# Patient Record
Sex: Female | Born: 1980 | Race: White | Hispanic: No | Marital: Married | Smoking: Never smoker
Health system: Southern US, Community
[De-identification: ages and names within clinical notes are randomized; demographics above are authoritative.]

## PROBLEM LIST (undated history)

## (undated) DIAGNOSIS — I10 Essential (primary) hypertension: Secondary | ICD-10-CM

## (undated) DIAGNOSIS — E119 Type 2 diabetes mellitus without complications: Secondary | ICD-10-CM

## (undated) HISTORY — PX: CHOLECYSTECTOMY: SHX55

## (undated) HISTORY — PX: THYROIDECTOMY: SHX17

---

## 2008-04-14 ENCOUNTER — Emergency Department: Payer: Self-pay | Admitting: Emergency Medicine

## 2008-04-15 DIAGNOSIS — Z9049 Acquired absence of other specified parts of digestive tract: Secondary | ICD-10-CM | POA: Insufficient documentation

## 2008-08-21 ENCOUNTER — Encounter: Payer: Self-pay | Admitting: Maternal and Fetal Medicine

## 2008-08-25 ENCOUNTER — Ambulatory Visit: Payer: Self-pay | Admitting: Obstetrics and Gynecology

## 2008-08-28 ENCOUNTER — Encounter: Payer: Self-pay | Admitting: Obstetrics & Gynecology

## 2008-09-01 ENCOUNTER — Ambulatory Visit: Payer: Self-pay | Admitting: Obstetrics and Gynecology

## 2008-09-04 ENCOUNTER — Encounter: Payer: Self-pay | Admitting: Family

## 2008-09-07 ENCOUNTER — Encounter: Payer: Self-pay | Admitting: Family

## 2008-09-07 ENCOUNTER — Encounter: Payer: Self-pay | Admitting: Maternal and Fetal Medicine

## 2008-09-07 ENCOUNTER — Ambulatory Visit: Payer: Self-pay | Admitting: Obstetrics and Gynecology

## 2008-09-12 ENCOUNTER — Ambulatory Visit: Payer: Self-pay | Admitting: Internal Medicine

## 2008-09-12 ENCOUNTER — Ambulatory Visit: Payer: Self-pay | Admitting: Obstetrics & Gynecology

## 2008-09-16 ENCOUNTER — Ambulatory Visit: Payer: Self-pay | Admitting: Obstetrics & Gynecology

## 2008-09-22 ENCOUNTER — Encounter: Payer: Self-pay | Admitting: Maternal & Fetal Medicine

## 2009-11-26 ENCOUNTER — Ambulatory Visit (HOSPITAL_COMMUNITY): Admission: RE | Admit: 2009-11-26 | Discharge: 2009-11-26 | Payer: Self-pay | Admitting: Obstetrics and Gynecology

## 2010-02-27 ENCOUNTER — Encounter: Payer: Self-pay | Admitting: Obstetrics and Gynecology

## 2010-06-25 ENCOUNTER — Inpatient Hospital Stay (HOSPITAL_COMMUNITY)
Admission: AD | Admit: 2010-06-25 | Discharge: 2010-06-28 | Disposition: A | Discharge status: Home / Self Care | Payer: BC Managed Care – PPO | Source: Ambulatory Visit | Attending: Obstetrics and Gynecology | Admitting: Obstetrics and Gynecology

## 2010-06-25 DIAGNOSIS — O10019 Pre-existing essential hypertension complicating pregnancy, unspecified trimester: Secondary | ICD-10-CM | POA: Insufficient documentation

## 2010-06-29 ENCOUNTER — Other Ambulatory Visit: Payer: Self-pay | Admitting: Obstetrics and Gynecology

## 2010-06-29 ENCOUNTER — Encounter (HOSPITAL_COMMUNITY): Payer: BC Managed Care – PPO

## 2010-06-29 LAB — BASIC METABOLIC PANEL
BUN: 9 mg/dL (ref 6–23)
Calcium: 9.2 mg/dL (ref 8.4–10.5)
Creatinine, Ser: 0.56 mg/dL (ref 0.4–1.2)
GFR calc non Af Amer: >60 mL/min (ref >60)
Glucose, Bld: 114 mg/dL — ABNORMAL HIGH (ref 70–99)

## 2010-06-29 LAB — CBC
HCT: 38.1 % (ref 36.0–46.0)
MCH: 31.2 pg (ref 26.0–34.0)
MCHC: 33.9 g/dL (ref 30.0–36.0)
MCV: 92.3 fL (ref 78.0–100.0)
RDW: 13.8 % (ref 11.5–15.5)

## 2010-07-09 ENCOUNTER — Other Ambulatory Visit: Payer: Self-pay | Admitting: Obstetrics and Gynecology

## 2010-07-09 ENCOUNTER — Inpatient Hospital Stay (HOSPITAL_COMMUNITY)
Admission: EM | Admit: 2010-07-09 | Discharge: 2010-07-13 | DRG: 370 | Disposition: A | Discharge status: Home / Self Care | Payer: BC Managed Care – PPO | Source: Ambulatory Visit | Attending: Obstetrics and Gynecology | Admitting: Obstetrics and Gynecology

## 2010-07-09 DIAGNOSIS — O1002 Pre-existing essential hypertension complicating childbirth: Secondary | ICD-10-CM | POA: Diagnosis present

## 2010-07-09 DIAGNOSIS — O321XX0 Maternal care for breech presentation, not applicable or unspecified: Principal | ICD-10-CM | POA: Diagnosis present

## 2010-07-09 DIAGNOSIS — E119 Type 2 diabetes mellitus without complications: Secondary | ICD-10-CM | POA: Diagnosis present

## 2010-07-09 DIAGNOSIS — N83209 Unspecified ovarian cyst, unspecified side: Secondary | ICD-10-CM | POA: Diagnosis present

## 2010-07-09 DIAGNOSIS — O34599 Maternal care for other abnormalities of gravid uterus, unspecified trimester: Secondary | ICD-10-CM | POA: Diagnosis present

## 2010-07-09 DIAGNOSIS — O2432 Unspecified pre-existing diabetes mellitus in childbirth: Secondary | ICD-10-CM | POA: Diagnosis present

## 2010-07-09 DIAGNOSIS — O3660X0 Maternal care for excessive fetal growth, unspecified trimester, not applicable or unspecified: Secondary | ICD-10-CM | POA: Diagnosis present

## 2010-07-09 LAB — GLUCOSE, CAPILLARY
Glucose-Capillary: 83 mg/dL (ref 70–99)
Glucose-Capillary: 132 mg/dL — ABNORMAL HIGH (ref 70–99)

## 2010-07-09 LAB — TYPE AND SCREEN: ABO/RH(D): O POS

## 2010-07-09 LAB — ABO/RH: ABO/RH(D): O POS

## 2010-07-10 LAB — CBC
MCV: 92.7 fL (ref 78.0–100.0)
Platelets: 192 10*3/uL (ref 150–400)
RDW: 13.8 % (ref 11.5–15.5)
WBC: 10.9 10*3/uL — ABNORMAL HIGH (ref 4.0–10.5)

## 2010-07-10 LAB — GLUCOSE, CAPILLARY

## 2010-07-10 LAB — CCBB MATERNAL DONOR DRAW

## 2010-07-12 LAB — GLUCOSE, CAPILLARY: Glucose-Capillary: 86 mg/dL (ref 70–99)

## 2010-07-14 ENCOUNTER — Inpatient Hospital Stay (HOSPITAL_COMMUNITY)
Admission: AD | Admit: 2010-07-14 | Payer: BC Managed Care – PPO | Source: Ambulatory Visit | Admitting: Obstetrics and Gynecology

## 2010-07-15 ENCOUNTER — Encounter (HOSPITAL_COMMUNITY)
Admission: RE | Admit: 2010-07-15 | Discharge: 2010-07-15 | Disposition: A | Discharge status: Home / Self Care | Payer: BC Managed Care – PPO | Source: Ambulatory Visit | Attending: Obstetrics and Gynecology | Admitting: Obstetrics and Gynecology

## 2010-07-15 DIAGNOSIS — O923 Agalactia: Secondary | ICD-10-CM | POA: Insufficient documentation

## 2010-07-16 NOTE — H&P (Signed)
Tracey Kennedy, TEDRICK NO.:  [id number]  MEDICAL RECORD NO.:  [id number]           PATIENT TYPE:  LOCATION:                                 FACILITY:  PHYSICIAN:  Huel Cote, M.D. DATE OF BIRTH:  May 16, 1980  DATE OF ADMISSION:  07/09/2010 DATE OF DISCHARGE:                             HISTORY & PHYSICAL   HISTORY OF PRESENT ILLNESS:  The patient is a 30 year old G3, P0-0-2-0, who is coming in at 76 and 2/7 weeks' gestation with an estimated due date of July 14, 2010 by her last menstrual period consistent with an early ultrasound at 6 week.  The patient is presenting for cesarean section given the persistent breech presentation with a macrosomic fetus greater than the 95th percentile on her last ultrasound.  Prenatal care had been complicated by diabetes which was preexisting to pregnancy and controlled well with metformin 500 mg p.o. q.a.m. and 1000 mg q.p.m., this remained stable throughout pregnancy.  She also had chronic hypertension which required labetalol 200 mg p.o. twice daily which had been increased at one point in the pregnancy.  She also had a left ovarian cyst diagnosed in the pregnancy which measured 6 x 5 x 3 and appeared simple in nature and was followed on ultrasound which we will address at the time of cesarean section as well.  PRENATAL LABS ARE AS FOLLOWS:  O positive, antibody negative, RPR nonreactive, rubella immune, hepatitis B surface antigen negative, HIV negative, hemoglobin A1c early on in pregnancy was 5.6, hemoglobin electrophoresis showed normal at AA, gonorrhea negative, Chlamydia negative, first trimester screen was normal, alpha fetal protein was normal.  Cystic fibrosis screen was normal.  She had a normal anatomical ultrasound performed at 19-20 weeks' gestation.  Her group B strep was negative and of course she did not perform 1-hour Glucola due to her diabetic nature.  As stated her diabetes remained well  controlled throughout pregnancy on her metformin and she checked her blood sugars 2- 4 times daily as directed.  She was followed by nonstress test twice weekly on her last being 6 weeks of pregnancy due to her medications and these were always reassuring.  PAST OBSTETRICAL HISTORY:  Significant for 2 prior miscarriages, the first in 2010 and the second in 2011, 1 requiring a D and C.  PAST MEDICAL HISTORY:  Significant for hypertension and diabetes as discussed.  PAST GYNECOLOGICAL HISTORY:  Significant for polycystic ovarian syndrome and was able to conceive on metformin alone.  PAST SURGICAL HISTORY:  She had a cholecystectomy in March 2010 and a D and C with her miscarriage in 2010.  ALLERGIES:  None except for PENICILLINS.  CURRENT MEDICATIONS AS FOLLOWS: 1. Metformin 500 mg p.o. q.a.m. and 1000 mg p.o. at night. 2. She uses prenatal vitamins and the labetalol 200 mg p.o. twice     daily as well.  SOCIAL HISTORY:  She has no alcohol, drugs, or tobacco use.  She is married.  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  FAMILY MEDICAL HISTORY:  Significant for diabetes in her mother, chronic hypertension in her father, and multiple myeloma in her  mother.  PHYSICAL EXAMINATION:  VITAL SIGNS:  The patient's blood pressure is 120/90.  She weighs 190 pounds. CARDIAC:  Regular rate and rhythm. LUNGS:  Clear. ABDOMEN:  Gravid and nontender, for the fundal height of 41, cervix is 150, and -2 station with a breech presentation noted.  After careful discussion when the baby was found to be breech, the patient declined any attempted external cephalic version given the baby was also measuring macrosomic greater than 95th percentile.  The baby remained persistent breech over the next several weeks and thus a cesarean section has been scheduled.  The risks and benefits of C- section were discussed with the patient in detail including bleeding, infection, and  possible damage to bowel, bladder.  She understands these risks and desires to proceed with the surgery as stated.  We will also evaluate the left ovary with the surgery and either perform a cystectomy or an LSO as needed dependent on the ovaries appearance.  The patient understands all of these issues and desires to proceed as stated.     Huel Cote, M.D.     KR/MEDQ  D:  07/08/2010  T:  07/08/2010  Job:  [id number]  Electronically Signed by Huel Cote M.D. on 07/16/2010 11:00:00 PM

## 2010-07-16 NOTE — Op Note (Signed)
Tracey Kennedy, Tracey Kennedy           ACCOUNT NO.:  [id number]  MEDICAL RECORD NO.:  [id number]           PATIENT TYPE:  I  LOCATION:  9131                          [facility]:  [facility]  PHYSICIAN:  Huel Cote, M.D. DATE OF BIRTH:  Jun 13, 1980  DATE OF PROCEDURE:  07/09/2010 DATE OF DISCHARGE:                              OPERATIVE REPORT   PREOPERATIVE DIAGNOSES: 1. Term pregnancy at 39 weeks. 2. Breech presentation. 3. Diabetes. 4. Suspected macrosomia. 5. Left adnexal mass.  POSTOPERATIVE DIAGNOSES: 1. Term pregnancy at 39 weeks. 2. Breech presentation. 3. Diabetes. 4. Suspected macrosomia. 5. Left adnexal mass.  PROCEDURES:  Primary low transverse cesarean section with two-layer closure of uterus and a left salpingo-oophorectomy for a very large mucinous cyst.  SURGEON:  Huel Cote, MD  ASSISTANT:  Malachi Pro. Ambrose Mantle, MD  ANESTHESIA:  Spinal.  FINDINGS:  There is a viable female infant in the breech presentation, Apgars were 8 and 9, weight was 9 pounds 2 ounces.  There was normal uterus noted and a normal right ovary.  The left ovary was quite large and elongated with a cystic-appearing structure measuring approximately 7 x 5.  There was no normal ovary tissue visible and in attempting to shell out the cyst large amount of mucinous fluid was encountered and suctioned.  For this reason, decision was made to proceed with a left salpingo-oophorectomy.  SPECIMENS:  Left ovary and tube were sent to Pathology.  Placenta was sent to L and D.  ESTIMATED BLOOD LOSS:  800 mL.  URINE OUTPUT:  100 mL clear urine.  IV FLUIDS:  2500 mL LR.  COMPLICATIONS:  There were no known complications.  PROCEDURE:  The patient was taken to the operating room, where spinal anesthesia was obtained and found to be adequate by Allis clamp test. She was then prepped and draped in the normal sterile fashion in the dorsal supine position with a leftward tilt.  A Pfannenstiel  skin incision was then made and carried through to underlying layer of fascia by sharp dissection and Bovie cautery.  The fascia was then nicked in the midline and the incision was extended laterally with Mayo scissors. The inferior aspect of the incision was grasped with Kocher clamps, elevated and dissected off the underlying rectus muscles.  In a similar fashion, the superior aspect was dissected off the rectus muscles. These were then separated in the midline and the incision was extended both superiorly and inferiorly with careful attention to avoid both bowel and bladder.  The Alexis self-retaining retractor was then placed within the incision and the bladder flap created with Metzenbaum scissors.  The uterus was then incised in a transverse fashion and the cavity itself entered bluntly.  The uterine incision was then extended bluntly and the infant was frank breech in presentation.  The infant was then delivered with the legs carefully reduced after delivery of the bottom.  The arms were reduced over the chest and the head delivered in a flexed position.  Nose and mouth were bulb suctioned and the infant was handed off to the waiting pediatricians.  There were several sinuses which were bleeding briskly at the incision  which were controlled with ring forceps.  The placenta was then expressed manually and handed off for cord blood donation.  The uterus was cleared of all clots and debris with moist lap sponge.  The uterine incision was then closed in two layers, the first a running locked layer of 1-0 chromic.  The second an imbricating layer of the same suture.  Additional reinforcing sutures in a figure-of-eight fashion were placed at the midline and at the right angle.  Good hemostasis was then noted.  Attention was then turned to the left adnexa which was elevated and carefully inspected.  The ovary appeared to be completely consumed with a very elongated cyst measuring probably  approximately 7 x 5 cm.  The ovary tissue did not have any apparent normal tissue left, but close to the hilum, attempt was made to begin shelling out the cyst and a large amount of mucinous substance was encountered and suctioned directly into the suction with care to avoid spillage into the abdomen.  At this point, there was really no visible ovarian tissue, so the decision was made to proceed with an LSO.  Two Kelly clamps were placed across the infundibulopelvic and the utero- ovarian ligament and the left adnexa was amputated with Mayo scissors. The pedicle was then carefully suture ligated with several sutures of 2- 0 Vicryl.  The infundibulopelvic ligament was also reinforced with another figure-of-eight suture of 2-0 Vicryl.  At the conclusion, there was no bleeding noted in that area with careful inspection.  The right tube and ovary were inspected and found to be normal and attention was returned to the incision which appeared hemostatic.  Any areas of oozing were treated with Bovie cautery.  All sutures and instruments were then removed from the abdomen.  The rectus muscles were then reapproximated in the midline with the peritoneum in several interrupted mattress sutures of 0 Vicryl.  The fascia was closed with 0 Vicryl in a running fashion.  The subcutaneous tissue was reapproximated with 2-0 plain in a running fashion.  The skin was then closed with 4-0 Vicryl in a subcuticular stitch and Steri-Strips.     Huel Cote, M.D.     KR/MEDQ  D:  07/09/2010  T:  07/10/2010  Job:  [id number]  Electronically Signed by Huel Cote M.D. on 07/16/2010 11:00:05 PM

## 2010-08-04 NOTE — Discharge Summary (Signed)
NAMEHEDDA, Tracey Kennedy NO.:  [id number]  MEDICAL RECORD NO.:  [id number]  LOCATION:  9131                          FACILITY:  [facility]  PHYSICIAN:  Huel Cote, M.D. DATE OF BIRTH:  01/10/1981  DATE OF ADMISSION:  07/09/2010 DATE OF DISCHARGE:  07/13/2010                              DISCHARGE SUMMARY   DISCHARGE DIAGNOSES: 1. Term pregnancy at 39 weeks delivered. 2. Breech presentation. 3. Gestational diabetes. 4. Suspected macrosomia. 5. Left adnexal mass. 6. Status post primary low transverse C-section with two-layer closure     of uterus and a left salpingo-oophorectomy for a very large     mucinous ovarian cyst. 7. Neonatal seizures of unknown etiology being worked up on the baby     at the time of discharge.  HOSPITAL COURSE:  The patient is a 30 year old G3, P 0-0-2-0, who came in at 39-2/[redacted] weeks gestation with an estimated due date of July 14, 2010 by her last menstrual period consistent with a 6-week ultrasound.  She presented for a scheduled cesarean section due to breech presentation and macrosomic fetus greater than the 95th percentile on her last ultrasound.  Prenatal care had been complicated by diabetes which was actually preexisting prior to pregnancy but remained well controlled on metformin 500 mg p.o. q.a.m. and 1000 mg p.o. q.p.m.  She also had chronic hypertension which remained stable on labetalol 200 mg p.o. twice daily.  At the time of pregnancy and delivery, she also had a left adnexal mass, which was diagnosed with her pregnancy measuring 6 x 5 x 3 and appeared simple in nature so was scheduled to be removed with her cesarean section.  For the remainder of her history and physical please see the previously dictated version.  On July 09, 2010, the patient underwent a primary low transverse C-section with two-layer closure of the uterus and a left salpingo-oophorectomy for what on final pathology was a large benign mucinous ovarian  cyst.  She was then admitted for her routine postoperative care.  On postop day #1, she was doing well.  Her hemoglobin was 10.8.  The baby was able to be in the room with her and had normal blood sugars.  On postop day #2, unfortunately, the baby started to exhibit seizure-like behavior and was transferred to the NICU where the baby was closely observed and appeared to continue to have seizures.  A neurologic consult was obtained by Dr. Sharene Skeans who could not identify any specific etiology.  An MRI showed very mild subdural hemorrhage which was felt to be unrelated given that she was scheduled C- section delivery.  The patient stayed in-house until postop day #4, and at that point, was discharged to home.  Her incision was clear.  She was afebrile with stable vital signs.  Her blood pressure was stable on her labetalol and blood sugars were running within normal limits, continuing on her metformin.  The patient was instructed to follow up in 2 weeks for an incision check in the office and we will have her full postpartum exam in 6 weeks.  The baby is point to continue in the NICU under observation and have a continuing work up of seizure issue.  Huel Cote, M.D.     KR/MEDQ  D:  08/03/2010  T:  08/04/2010  Job:  [id number]  Electronically Signed by Huel Cote M.D. on 08/04/2010 08:59:00 AM

## 2010-08-15 ENCOUNTER — Encounter (HOSPITAL_COMMUNITY)
Admission: RE | Admit: 2010-08-15 | Discharge: 2010-08-15 | Disposition: A | Discharge status: Home / Self Care | Payer: BC Managed Care – PPO | Source: Ambulatory Visit | Attending: Obstetrics and Gynecology | Admitting: Obstetrics and Gynecology

## 2010-08-15 DIAGNOSIS — O923 Agalactia: Secondary | ICD-10-CM | POA: Insufficient documentation

## 2010-09-15 ENCOUNTER — Encounter (HOSPITAL_COMMUNITY)
Admission: RE | Admit: 2010-09-15 | Discharge: 2010-09-15 | Disposition: A | Discharge status: Home / Self Care | Payer: BC Managed Care – PPO | Source: Ambulatory Visit | Attending: Obstetrics and Gynecology | Admitting: Obstetrics and Gynecology

## 2010-09-15 DIAGNOSIS — O923 Agalactia: Secondary | ICD-10-CM | POA: Insufficient documentation

## 2012-04-17 ENCOUNTER — Emergency Department: Payer: Self-pay | Admitting: Emergency Medicine

## 2012-04-17 LAB — CBC
HGB: 13.8 g/dL (ref 12.0–16.0)
MCV: 85 fL (ref 80–100)
RBC: 4.72 10*6/uL (ref 3.80–5.20)

## 2012-04-17 LAB — LIPASE, BLOOD: Lipase: 113 U/L (ref 73–393)

## 2012-04-17 LAB — COMPREHENSIVE METABOLIC PANEL
Alkaline Phosphatase: 75 U/L (ref 50–136)
Anion Gap: 8 (ref 7–16)
Creatinine: 0.78 mg/dL (ref 0.60–1.30)
EGFR (Non-African Amer.): >60
Glucose: 182 mg/dL — ABNORMAL HIGH (ref 65–99)
SGPT (ALT): 21 U/L (ref 12–78)
Sodium: 136 mmol/L (ref 136–145)

## 2012-04-17 LAB — URINALYSIS, COMPLETE
Glucose,UR: NEGATIVE mg/dL (ref 0–75)
Ph: 5 (ref 4.5–8.0)
RBC,UR: 4 /HPF (ref 0–5)
Specific Gravity: 1.023 (ref 1.003–1.030)
Squamous Epithelial: 13
WBC UR: 2 /HPF (ref 0–5)

## 2014-09-21 ENCOUNTER — Other Ambulatory Visit: Payer: Self-pay | Admitting: Family Medicine

## 2014-09-22 NOTE — Telephone Encounter (Signed)
This is a pt of Dr. Santiago Bur. Last office visit was on 08/05/2013. Last time this medication was refilled was on 02/27/2014.  Thanks,

## 2014-10-08 ENCOUNTER — Encounter: Payer: Self-pay | Admitting: Family Medicine

## 2014-10-19 ENCOUNTER — Other Ambulatory Visit: Payer: Self-pay | Admitting: Family Medicine

## 2014-10-19 DIAGNOSIS — E119 Type 2 diabetes mellitus without complications: Secondary | ICD-10-CM

## 2014-10-19 DIAGNOSIS — I10 Essential (primary) hypertension: Secondary | ICD-10-CM

## 2014-10-20 DIAGNOSIS — E1159 Type 2 diabetes mellitus with other circulatory complications: Secondary | ICD-10-CM | POA: Insufficient documentation

## 2014-10-20 DIAGNOSIS — E119 Type 2 diabetes mellitus without complications: Secondary | ICD-10-CM | POA: Insufficient documentation

## 2014-10-20 DIAGNOSIS — I152 Hypertension secondary to endocrine disorders: Secondary | ICD-10-CM | POA: Insufficient documentation

## 2014-10-20 DIAGNOSIS — I10 Essential (primary) hypertension: Secondary | ICD-10-CM | POA: Insufficient documentation

## 2014-10-20 NOTE — Telephone Encounter (Signed)
Last OV 07/2013  Thanks,   -Laura  

## 2014-11-16 ENCOUNTER — Other Ambulatory Visit: Payer: Self-pay | Admitting: Family Medicine

## 2014-11-16 DIAGNOSIS — I10 Essential (primary) hypertension: Secondary | ICD-10-CM

## 2014-11-17 NOTE — Telephone Encounter (Signed)
Last OV 07/2013  Thanks,   -Orel Hord  

## 2015-04-07 ENCOUNTER — Other Ambulatory Visit: Payer: Self-pay | Admitting: Family Medicine

## 2015-07-23 ENCOUNTER — Other Ambulatory Visit: Payer: Self-pay | Admitting: Family Medicine

## 2015-07-23 DIAGNOSIS — I10 Essential (primary) hypertension: Secondary | ICD-10-CM

## 2015-07-25 ENCOUNTER — Other Ambulatory Visit: Payer: Self-pay | Admitting: Family Medicine

## 2015-07-25 NOTE — Telephone Encounter (Signed)
Needs OV. Tracey Kennedy, CMA  

## 2015-08-23 ENCOUNTER — Other Ambulatory Visit: Payer: Self-pay | Admitting: Family Medicine

## 2015-08-23 DIAGNOSIS — I10 Essential (primary) hypertension: Secondary | ICD-10-CM

## 2015-09-11 DIAGNOSIS — Z6828 Body mass index (BMI) 28.0-28.9, adult: Secondary | ICD-10-CM | POA: Insufficient documentation

## 2015-09-11 DIAGNOSIS — E282 Polycystic ovarian syndrome: Secondary | ICD-10-CM | POA: Insufficient documentation

## 2015-09-11 DIAGNOSIS — Z6829 Body mass index (BMI) 29.0-29.9, adult: Secondary | ICD-10-CM

## 2015-09-14 ENCOUNTER — Ambulatory Visit (INDEPENDENT_AMBULATORY_CARE_PROVIDER_SITE_OTHER): Payer: BC Managed Care – PPO | Admitting: Physician Assistant

## 2015-09-14 ENCOUNTER — Encounter: Payer: Self-pay | Admitting: Physician Assistant

## 2015-09-14 VITALS — BP 124/88 | HR 76 | Temp 98.2°F | Resp 16 | Wt 176.0 lb

## 2015-09-14 DIAGNOSIS — I10 Essential (primary) hypertension: Secondary | ICD-10-CM

## 2015-09-14 DIAGNOSIS — E119 Type 2 diabetes mellitus without complications: Secondary | ICD-10-CM | POA: Diagnosis not present

## 2015-09-14 LAB — POCT GLYCOSYLATED HEMOGLOBIN (HGB A1C)
ESTIMATED AVERAGE GLUCOSE: 137
Hemoglobin A1C: 6.4

## 2015-09-14 MED ORDER — METFORMIN HCL 500 MG PO TABS: 500.0000 mg | ORAL_TABLET | Freq: Two times a day (BID) | ORAL | 0 refills | Status: DC

## 2015-09-14 MED ORDER — GLUCOSE BLOOD VI STRP: ORAL_STRIP | 12 refills | Status: DC

## 2015-09-14 MED ORDER — ONETOUCH ULTRASOFT LANCETS MISC: 12 refills | Status: DC

## 2015-09-14 MED ORDER — LABETALOL HCL 200 MG PO TABS: 200.0000 mg | ORAL_TABLET | Freq: Two times a day (BID) | ORAL | 3 refills | Status: DC

## 2015-09-14 MED ORDER — ALCOHOL PREP PADS: MEDICATED_PAD | 12 refills | Status: DC

## 2015-09-14 MED ORDER — TRIAMTERENE-HCTZ 37.5-25 MG PO TABS: 1.0000 | ORAL_TABLET | Freq: Every day | ORAL | 3 refills | Status: DC

## 2015-09-14 NOTE — Patient Instructions (Signed)

## 2015-09-14 NOTE — Progress Notes (Signed)
Patient: Tracey PickupStephanie D Galvis Female    DOB: 1980/05/11   35 y.o.   MRN: [id number] Visit Date: 09/14/2015  Today's Provider: Margaretann LovelessJennifer M Priyah Schmuck, PA-C   Chief Complaint  Patient presents with  . Medication Refill  . Diabetes  . Hypertension   Subjective:    HPI Patient is here for Medication Refills  Diabetes Mellitus Type II, Follow-up:   Lab Results  Component Value Date   HGBA1C 6.4 09/14/2015    Last seen for diabetes 12 months ago or more.  Management since then includes None. She reports excellent compliance with treatment. She is not having side effects.  Current symptoms include none and have been stable. Home blood sugar records: fasting 110-125  Episodes of hypoglycemia? no   Taking Metformin 500 MG BID Most Recent Eye Exam: Last Summer Weight trend: stable Current diet: well balanced Current exercise: none   Pertinent Labs:    Component Value Date/Time   CREATININE 0.78 04/17/2012 0139    Wt Readings from Last 3 Encounters:  09/14/15 176 lb (79.8 kg)    ------------------------------------------------------------------------  Hypertension, follow-up:  BP Readings from Last 3 Encounters:  09/14/15 124/88    She was last seen for hypertension 12 months ago or more.  Management since that visit includes noe. She reports excellent compliance with treatment. She is not having side effects.  She is not exercising. She is adherent to low salt diet. (moderate) Outside blood pressures are 120's and 80's. She is experiencing none.  Patient denies chest pain, chest pressure/discomfort, dyspnea, fatigue, irregular heart beat, lower extremity edema and palpitations.   Cardiovascular risk factors include diabetes mellitus, hypertension and obesity (BMI >= 30 kg/m2).     Weight trend: stable Wt Readings from Last 3 Encounters:  09/14/15 176 lb (79.8 kg)    Current diet: well  balanced  ------------------------------------------------------------------------     Allergies  Allergen Reactions  . Penicillins    Current Meds  Medication Sig  . CAMILA 0.35 MG tablet Take 1 tablet by mouth daily.  Marland Kitchen. labetalol (NORMODYNE) 200 MG tablet Take 1 tablet (200 mg total) by mouth 2 (two) times daily.  . metFORMIN (GLUCOPHAGE) 500 MG tablet Take 1 tablet (500 mg total) by mouth 2 (two) times daily.  Marland Kitchen. triamterene-hydrochlorothiazide (MAXZIDE-25) 37.5-25 MG tablet Take 1 tablet by mouth daily.  . [DISCONTINUED] labetalol (NORMODYNE) 200 MG tablet TAKE 1 TABLET (200 MG TOTAL) BY MOUTH 2 (TWO) TIMES DAILY. NEED OFFICE VISIT BEFORE ANYMORE REFILLS.  . [DISCONTINUED] metFORMIN (GLUCOPHAGE) 500 MG tablet Take 1 tablet (500 mg total) by mouth 2 (two) times daily. Pt needs OV before anymore refills.  . [DISCONTINUED] triamterene-hydrochlorothiazide (MAXZIDE-25) 37.5-25 MG tablet TAKE 1 TABLET BY MOUTH DAILY. NEED OFFIVE VISIT BEFORE ANYMORE REFILLS.    Review of Systems  Constitutional: Negative for fatigue.  Eyes: Negative for visual disturbance.  Respiratory: Negative for cough, chest tightness and shortness of breath.   Cardiovascular: Negative for chest pain, palpitations and leg swelling.  Gastrointestinal: Negative for abdominal pain.  Endocrine: Negative for polydipsia.  Neurological: Negative for dizziness, light-headedness and headaches.    Social History  Substance Use Topics  . Smoking status: Never Smoker  . Smokeless tobacco: Never Used  . Alcohol use No   Objective:   BP 124/88 (BP Location: Left Arm, Patient Position: Sitting, Cuff Size: Normal)   Pulse 76   Temp 98.2 F (36.8 C) (Oral)   Resp 16   Wt 176 lb (79.8 kg)  LMP 08/20/2015   Physical Exam  Constitutional: She appears well-developed and well-nourished. No distress.  Cardiovascular: Normal rate, regular rhythm and normal heart sounds.  Exam reveals no gallop and no friction rub.   No  murmur heard. Pulmonary/Chest: Effort normal and breath sounds normal. No respiratory distress. She has no wheezes. She has no rales.  Musculoskeletal: She exhibits no edema.  Skin: She is not diaphoretic.  Vitals reviewed.     Assessment & Plan:     1. Type 2 diabetes mellitus without complication, unspecified long term insulin use status (HCC) HgBA1c was 6.4 today. She is trying to work on lifestyle modifications with dieting and exercise. Medications and supplies were refilled as below. I will see her back in 6 months and we will recheck all labs at that time. - POCT glycosylated hemoglobin (Hb A1C) - glucose blood (ONE TOUCH ULTRA TEST) test strip; Check blood glucose readings ACHS  Dispense: 100 each; Refill: 12 - Lancets (ONETOUCH ULTRASOFT) lancets; Check blood sugar ACHS  Dispense: 100 each; Refill: 12 - Alcohol Swabs (ALCOHOL PREP) PADS; Use one pad to cleanse skin prior to checking blood sugar ACHS  Dispense: 100 each; Refill: 12 - metFORMIN (GLUCOPHAGE) 500 MG tablet; Take 1 tablet (500 mg total) by mouth 2 (two) times daily.  Dispense: 60 tablet; Refill: 0  2. Essential hypertension Stable. Diagnosis pulled for medication refill. Continue current medical treatment plan. I will see her back in 6 months and recheck all labs at that time. - labetalol (NORMODYNE) 200 MG tablet; Take 1 tablet (200 mg total) by mouth 2 (two) times daily.  Dispense: 180 tablet; Refill: 3 - triamterene-hydrochlorothiazide (MAXZIDE-25) 37.5-25 MG tablet; Take 1 tablet by mouth daily.  Dispense: 90 tablet; Refill: 3       Margaretann Loveless, PA-C  [facility]

## 2015-09-21 ENCOUNTER — Other Ambulatory Visit: Payer: Self-pay | Admitting: Physician Assistant

## 2015-09-21 DIAGNOSIS — E119 Type 2 diabetes mellitus without complications: Secondary | ICD-10-CM

## 2015-09-21 MED ORDER — GLUCOSE BLOOD VI STRP: ORAL_STRIP | 12 refills | Status: DC

## 2015-09-21 NOTE — Telephone Encounter (Signed)
Pt needs refill just for the test strips.  One touch ultra mini  Pharmacy [facility]  Call back is [phone]  Thanks, Barth Kirksteri

## 2015-11-02 ENCOUNTER — Other Ambulatory Visit: Payer: Self-pay | Admitting: Physician Assistant

## 2015-11-02 DIAGNOSIS — E119 Type 2 diabetes mellitus without complications: Secondary | ICD-10-CM

## 2016-01-14 ENCOUNTER — Encounter: Payer: Self-pay | Admitting: Physician Assistant

## 2016-01-14 ENCOUNTER — Ambulatory Visit (INDEPENDENT_AMBULATORY_CARE_PROVIDER_SITE_OTHER): Payer: BC Managed Care – PPO | Admitting: Physician Assistant

## 2016-01-14 VITALS — BP 122/88 | HR 78 | Temp 98.0°F | Resp 16 | Wt 166.0 lb

## 2016-01-14 DIAGNOSIS — R05 Cough: Secondary | ICD-10-CM

## 2016-01-14 DIAGNOSIS — J069 Acute upper respiratory infection, unspecified: Secondary | ICD-10-CM | POA: Diagnosis not present

## 2016-01-14 DIAGNOSIS — R059 Cough, unspecified: Secondary | ICD-10-CM

## 2016-01-14 MED ORDER — AZITHROMYCIN 250 MG PO TABS: ORAL_TABLET | ORAL | 0 refills | Status: DC

## 2016-01-14 MED ORDER — HYDROCODONE-HOMATROPINE 5-1.5 MG/5ML PO SYRP: 5.0000 mL | ORAL_SOLUTION | Freq: Three times a day (TID) | ORAL | 0 refills | Status: DC | PRN

## 2016-01-14 NOTE — Patient Instructions (Signed)
Upper Respiratory Infection, Adult Most upper respiratory infections (URIs) are a viral infection of the air passages leading to the lungs. A URI affects the nose, throat, and upper air passages. The most common type of URI is nasopharyngitis and is typically referred to as "the common cold." URIs run their course and usually go away on their own. Most of the time, a URI does not require medical attention, but sometimes a bacterial infection in the upper airways can follow a viral infection. This is called a secondary infection. Sinus and middle ear infections are common types of secondary upper respiratory infections. Bacterial pneumonia can also complicate a URI. A URI can worsen asthma and chronic obstructive pulmonary disease (COPD). Sometimes, these complications can require emergency medical care and may be life threatening. What are the causes? Almost all URIs are caused by viruses. A virus is a type of germ and can spread from one person to another. What increases the risk? You may be at risk for a URI if:  You smoke.  You have chronic heart or lung disease.  You have a weakened defense (immune) system.  You are very young or very old.  You have nasal allergies or asthma.  You work in crowded or poorly ventilated areas.  You work in health care facilities or schools.  What are the signs or symptoms? Symptoms typically develop 2-3 days after you come in contact with a cold virus. Most viral URIs last 7-10 days. However, viral URIs from the influenza virus (flu virus) can last 14-18 days and are typically more severe. Symptoms may include:  Runny or stuffy (congested) nose.  Sneezing.  Cough.  Sore throat.  Headache.  Fatigue.  Fever.  Loss of appetite.  Pain in your forehead, behind your eyes, and over your cheekbones (sinus pain).  Muscle aches.  How is this diagnosed? Your health care provider may diagnose a URI by:  Physical exam.  Tests to check that your  symptoms are not due to another condition such as: ? Strep throat. ? Sinusitis. ? Pneumonia. ? Asthma.  How is this treated? A URI goes away on its own with time. It cannot be cured with medicines, but medicines may be prescribed or recommended to relieve symptoms. Medicines may help:  Reduce your fever.  Reduce your cough.  Relieve nasal congestion.  Follow these instructions at home:  Take medicines only as directed by your health care provider.  Gargle warm saltwater or take cough drops to comfort your throat as directed by your health care provider.  Use a warm mist humidifier or inhale steam from a shower to increase air moisture. This may make it easier to breathe.  Drink enough fluid to keep your urine clear or pale yellow.  Eat soups and other clear broths and maintain good nutrition.  Rest as needed.  Return to work when your temperature has returned to normal or as your health care provider advises. You may need to stay home longer to avoid infecting others. You can also use a face mask and careful hand washing to prevent spread of the virus.  Increase the usage of your inhaler if you have asthma.  Do not use any tobacco products, including cigarettes, chewing tobacco, or electronic cigarettes. If you need help quitting, ask your health care provider. How is this prevented? The best way to protect yourself from getting a cold is to practice good hygiene.  Avoid oral or hand contact with people with cold symptoms.  Wash your   hands often if contact occurs.  There is no clear evidence that vitamin C, vitamin E, echinacea, or exercise reduces the chance of developing a cold. However, it is always recommended to get plenty of rest, exercise, and practice good nutrition. Contact a health care provider if:  You are getting worse rather than better.  Your symptoms are not controlled by medicine.  You have chills.  You have worsening shortness of breath.  You have  brown or red mucus.  You have yellow or brown nasal discharge.  You have pain in your face, especially when you bend forward.  You have a fever.  You have swollen neck glands.  You have pain while swallowing.  You have white areas in the back of your throat. Get help right away if:  You have severe or persistent: ? Headache. ? Ear pain. ? Sinus pain. ? Chest pain.  You have chronic lung disease and any of the following: ? Wheezing. ? Prolonged cough. ? Coughing up blood. ? A change in your usual mucus.  You have a stiff neck.  You have changes in your: ? Vision. ? Hearing. ? Thinking. ? Mood. This information is not intended to replace advice given to you by your health care provider. Make sure you discuss any questions you have with your health care provider. Document Released: 07/20/2000 Document Revised: 09/27/2015 Document Reviewed: 05/01/2013 Elsevier Interactive Patient Education  2017 [employer]  

## 2016-01-14 NOTE — Progress Notes (Signed)
Patient: Tracey PickupStephanie D Rineer Female    DOB: Oct 27, 1980   35 y.o.   MRN: [id number] Visit Date: 01/14/2016  Today's Provider: Margaretann LovelessJennifer M Burnette, PA-C   Chief Complaint  Patient presents with  . Cough   Subjective:    Cough  This is a new problem. The current episode started 1 to 4 weeks ago (x 3-4 weeks). The problem has been unchanged. The cough is productive of sputum (greenish/yellow sputum). Associated symptoms include headaches (due to excessive coughing), nasal congestion and postnasal drip. Pertinent negatives include no chest pain, chills, ear congestion, ear pain, fever, heartburn, hemoptysis, myalgias, rhinorrhea, sore throat, shortness of breath, sweats, weight loss or wheezing. Exacerbated by: laughing. She has tried nothing for the symptoms. There is no history of asthma or environmental allergies.      Allergies  Allergen Reactions  . Penicillins      Current Outpatient Prescriptions:  .  Alcohol Swabs (ALCOHOL PREP) PADS, Use one pad to cleanse skin prior to checking blood sugar ACHS, Disp: 100 each, Rfl: 12 .  CAMILA 0.35 MG tablet, Take 1 tablet by mouth daily., Disp: , Rfl: 3 .  glucose blood (ONE TOUCH ULTRA TEST) test strip, Check blood glucose readings ACHS, Disp: 100 each, Rfl: 12 .  labetalol (NORMODYNE) 200 MG tablet, Take 1 tablet (200 mg total) by mouth 2 (two) times daily., Disp: 180 tablet, Rfl: 3 .  Lancets (ONETOUCH ULTRASOFT) lancets, Check blood sugar ACHS, Disp: 100 each, Rfl: 12 .  metFORMIN (GLUCOPHAGE) 500 MG tablet, TAKE 1 TABLET (500 MG TOTAL) BY MOUTH 2 (TWO) TIMES DAILY., Disp: 60 tablet, Rfl: 5 .  triamterene-hydrochlorothiazide (MAXZIDE-25) 37.5-25 MG tablet, Take 1 tablet by mouth daily., Disp: 90 tablet, Rfl: 3  Review of Systems  Constitutional: Negative for chills, fever and weight loss.  HENT: Positive for congestion, postnasal drip and sinus pain. Negative for ear pain, rhinorrhea, sneezing, sore throat and trouble swallowing.    Respiratory: Positive for cough. Negative for hemoptysis, chest tightness, shortness of breath and wheezing.   Cardiovascular: Negative for chest pain.  Gastrointestinal: Negative for abdominal pain, heartburn and nausea.  Musculoskeletal: Negative for myalgias.  Allergic/Immunologic: Negative for environmental allergies.  Neurological: Positive for headaches (due to excessive coughing). Negative for dizziness.    Social History  Substance Use Topics  . Smoking status: Never Smoker  . Smokeless tobacco: Never Used  . Alcohol use No   Objective:   BP 122/88 (BP Location: Left Arm, Patient Position: Sitting, Cuff Size: Normal)   Pulse 78   Temp 98 F (36.7 C) (Oral)   Resp 16   Wt 166 lb (75.3 kg)   LMP 12/31/2015   SpO2 98%   Physical Exam  Constitutional: She appears well-developed and well-nourished. No distress.  HENT:  Head: Normocephalic and atraumatic.  Right Ear: Hearing, tympanic membrane, external ear and ear canal normal.  Left Ear: Hearing, tympanic membrane, external ear and ear canal normal.  Nose: Mucosal edema and rhinorrhea present. Right sinus exhibits no maxillary sinus tenderness and no frontal sinus tenderness. Left sinus exhibits no maxillary sinus tenderness and no frontal sinus tenderness.  Mouth/Throat: Uvula is midline and mucous membranes are normal. Posterior oropharyngeal erythema present. No oropharyngeal exudate or posterior oropharyngeal edema.  Eyes: Conjunctivae are normal. Pupils are equal, round, and reactive to light. Right eye exhibits no discharge. Left eye exhibits no discharge. No scleral icterus.  Neck: Normal range of motion. Neck supple. No tracheal deviation present.  No thyromegaly present.  Cardiovascular: Normal rate, regular rhythm and normal heart sounds.  Exam reveals no gallop and no friction rub.   No murmur heard. Pulmonary/Chest: Effort normal and breath sounds normal. No stridor. No respiratory distress. She has no wheezes.  She has no rales.  Lymphadenopathy:    She has no cervical adenopathy.  Skin: Skin is warm and dry. She is not diaphoretic.  Vitals reviewed.       Assessment & Plan:     1. Upper respiratory tract infection, unspecified type Worsening symptoms that have not responded to over-the-counter medications for over a week. Will treat with Z-Pak as below. Patient may use Mucinex DM for daytime congestion and cough. Her to stay well-hydrated and get plenty of rest. She is to call the office if symptoms fail to improve or worsen. - azithromycin (ZITHROMAX) 250 MG tablet; Take 2 tablets PO on day one, and one tablet PO daily thereafter until completed.  Dispense: 6 tablet; Refill: 0  2. Cough Worsening symptoms that has not responded to OTC medications. Will give Hycodan cough syrup as below for nighttime cough. Drowsiness precautions given to patient. Stay well hydrated. Use delsym, robitussin OR mucinex for daytime cough. - HYDROcodone-homatropine (HYCODAN) 5-1.5 MG/5ML syrup; Take 5 mLs by mouth every 8 (eight) hours as needed.  Dispense: 180 mL; Refill: 0     Patient seen and examined by Joycelyn ManJennifer Burnette, PA, and note scribed by Allene DillonEmily Drozdowski, CMA.  Margaretann LovelessJennifer M Burnette, PA-C  [facility]

## 2016-03-10 ENCOUNTER — Ambulatory Visit
Admission: EM | Admit: 2016-03-10 | Discharge: 2016-03-10 | Disposition: A | Discharge status: Home / Self Care | Payer: BC Managed Care – PPO | Attending: Internal Medicine | Admitting: Internal Medicine

## 2016-03-10 ENCOUNTER — Encounter: Payer: Self-pay | Admitting: *Deleted

## 2016-03-10 DIAGNOSIS — N83201 Unspecified ovarian cyst, right side: Secondary | ICD-10-CM

## 2016-03-10 HISTORY — DX: Type 2 diabetes mellitus without complications: E11.9

## 2016-03-10 HISTORY — DX: Essential (primary) hypertension: I10

## 2016-03-10 LAB — URINALYSIS, COMPLETE (UACMP) WITH MICROSCOPIC
Bilirubin Urine: NEGATIVE
GLUCOSE, UA: NEGATIVE mg/dL
Hgb urine dipstick: NEGATIVE
KETONES UR: NEGATIVE mg/dL
LEUKOCYTES UA: NEGATIVE
Nitrite: NEGATIVE
PROTEIN: NEGATIVE mg/dL
Specific Gravity, Urine: 1.01 (ref 1.005–1.030)
pH: 6.5 (ref 5.0–8.0)

## 2016-03-10 NOTE — ED Triage Notes (Signed)
Patient started having abdominal and back pain 5 days ago. Patient reports vomiting and nausea with pain. Pain resolved for 3 days and returned today. Pain is no longer in lower back, but is now in her pelvic region.

## 2016-03-25 ENCOUNTER — Encounter: Payer: Self-pay | Admitting: Physician Assistant

## 2016-03-25 ENCOUNTER — Ambulatory Visit (INDEPENDENT_AMBULATORY_CARE_PROVIDER_SITE_OTHER): Payer: BC Managed Care – PPO | Admitting: Physician Assistant

## 2016-03-25 VITALS — BP 128/90 | HR 96 | Temp 98.2°F | Resp 16 | Wt 166.4 lb

## 2016-03-25 DIAGNOSIS — E119 Type 2 diabetes mellitus without complications: Secondary | ICD-10-CM | POA: Diagnosis not present

## 2016-03-25 DIAGNOSIS — E282 Polycystic ovarian syndrome: Secondary | ICD-10-CM

## 2016-03-25 DIAGNOSIS — I10 Essential (primary) hypertension: Secondary | ICD-10-CM

## 2016-03-25 NOTE — Progress Notes (Signed)
Patient: Tracey Kennedy Female    DOB: December 13, 1980   35 y.o.   MRN: [id number] Visit Date: 03/25/2016  Today's Provider: Margaretann LovelessJennifer M Burnette, PA-C   Chief Complaint  Patient presents with  . Follow-up    HTN and Diabetes   Subjective:    HPI  Diabetes Mellitus Type II, Follow-up:   Lab Results  Component Value Date   HGBA1C 6.4 09/14/2015    Last seen for diabetes 6 months ago.  Management since then includes none. She reports excellent compliance with treatment. She is not having side effects.  Current symptoms include none and have been stable. Home blood sugar records: 110's-120's  Episodes of hypoglycemia? no   Metformin 500 mg tablet ,take 1 tablet (500 mg total) by mouth 2 (two) times daily. Most Recent Eye Exam: Weight trend: stable Current diet: well balanced   Pertinent Labs:    Component Value Date/Time   CREATININE 0.78 04/17/2012 0139    Wt Readings from Last 3 Encounters:  03/25/16 166 lb 6.4 oz (75.5 kg)  03/10/16 165 lb (74.8 kg)  01/14/16 166 lb (75.3 kg)    ------------------------------------------------------------------------  Hypertension, follow-up:  BP Readings from Last 3 Encounters:  03/25/16 128/90  03/10/16 (!) 143/103  01/14/16 122/88    She was last seen for hypertension 6 months ago.  BP at that visit was 124/88. Management since that visit includes none. She reports excellent compliance with treatment. She is not having side effects.  She is exercising.Walking more. She is adherent to low salt diet.   She is experiencing none.  Patient denies chest pain, fatigue, irregular heart beat, lower extremity edema and palpitations.   Cardiovascular risk factors include diabetes mellitus and hypertension.     Weight trend: stable Wt Readings from Last 3 Encounters:  03/25/16 166 lb 6.4 oz (75.5 kg)  03/10/16 165 lb (74.8 kg)  01/14/16 166 lb (75.3 kg)    Current diet: well  balanced  ------------------------------------------------------------------------     Allergies  Allergen Reactions  . Penicillins      Current Outpatient Prescriptions:  .  Alcohol Swabs (ALCOHOL PREP) PADS, Use one pad to cleanse skin prior to checking blood sugar ACHS, Disp: 100 each, Rfl: 12 .  CAMILA 0.35 MG tablet, Take 1 tablet by mouth daily., Disp: , Rfl: 3 .  glucose blood (ONE TOUCH ULTRA TEST) test strip, Check blood glucose readings ACHS, Disp: 100 each, Rfl: 12 .  labetalol (NORMODYNE) 200 MG tablet, Take 1 tablet (200 mg total) by mouth 2 (two) times daily., Disp: 180 tablet, Rfl: 3 .  Lancets (ONETOUCH ULTRASOFT) lancets, Check blood sugar ACHS, Disp: 100 each, Rfl: 12 .  metFORMIN (GLUCOPHAGE) 500 MG tablet, TAKE 1 TABLET (500 MG TOTAL) BY MOUTH 2 (TWO) TIMES DAILY., Disp: 60 tablet, Rfl: 5 .  triamterene-hydrochlorothiazide (MAXZIDE-25) 37.5-25 MG tablet, Take 1 tablet by mouth daily., Disp: 90 tablet, Rfl: 3 .  azithromycin (ZITHROMAX) 250 MG tablet, Take 2 tablets PO on day one, and one tablet PO daily thereafter until completed. (Patient not taking: Reported on 03/25/2016), Disp: 6 tablet, Rfl: 0 .  HYDROcodone-homatropine (HYCODAN) 5-1.5 MG/5ML syrup, Take 5 mLs by mouth every 8 (eight) hours as needed. (Patient not taking: Reported on 03/25/2016), Disp: 180 mL, Rfl: 0  Review of Systems  Constitutional: Negative.   Respiratory: Negative.   Cardiovascular: Negative.   Gastrointestinal: Negative.   Genitourinary: Negative.   Neurological: Negative.     Social History  Substance Use Topics  . Smoking status: Never Smoker  . Smokeless tobacco: Never Used  . Alcohol use No   Objective:   BP 128/90 (BP Location: Left Arm, Patient Position: Sitting, Cuff Size: Normal)   Pulse 96   Temp 98.2 F (36.8 C) (Oral)   Resp 16   Wt 166 lb 6.4 oz (75.5 kg)   LMP 02/25/2016   BMI 29.48 kg/m   Physical Exam  Constitutional: She appears well-developed and  well-nourished. No distress.  Neck: Normal range of motion. Neck supple. No tracheal deviation present. No thyromegaly present.  Cardiovascular: Normal rate, regular rhythm and normal heart sounds.  Exam reveals no gallop and no friction rub.   No murmur heard. Pulmonary/Chest: Effort normal and breath sounds normal. No respiratory distress. She has no wheezes. She has no rales.  Musculoskeletal: She exhibits no edema.  Lymphadenopathy:    She has no cervical adenopathy.  Skin: She is not diaphoretic.  Vitals reviewed.     Assessment & Plan:     1. Essential hypertension Stable. Continue Maxzide 37.5-25mg . Will check labs as below and f/u pending results. - CBC w/Diff/Platelet - Comprehensive Metabolic Panel (CMET) - TSH  2. Type 2 diabetes mellitus without complication, unspecified long term insulin use status (HCC) Patient also has PCOS. Tolerating metformin. Will check labs as below and f/u pending results. - HgB A1c - Comprehensive Metabolic Panel (CMET)  3. Bilateral polycystic ovarian syndrome Left ovary removed secondary to cyst. Recently went to urgent care for right ovarian cyst.  - HgB A1c       Margaretann Loveless, PA-C  [facility]

## 2016-03-25 NOTE — Patient Instructions (Signed)
Polycystic Ovarian Syndrome Polycystic ovarian syndrome (PCOS) is a common hormonal disorder among women of reproductive age. In most women with PCOS, many small fluid-filled sacs (cysts) grow on the ovaries, and the cysts are not part of a normal menstrual cycle. PCOS can cause problems with your menstrual periods and make it difficult to get pregnant. It can also cause an increased risk of miscarriage with pregnancy. If it is not treated, PCOS can lead to serious health problems, such as diabetes and heart disease. What are the causes? The cause of PCOS is not known, but it may be the result of a combination of certain factors, such as:  Irregular menstrual cycle.  High levels of certain hormones (androgens).  Problems with the hormone that helps to control blood sugar (insulin resistance).  Certain genes. What increases the risk? This condition is more likely to develop in women who have a family history of PCOS. What are the signs or symptoms? Symptoms of PCOS may include:  Multiple ovarian cysts.  Infrequent periods or no periods.  Periods that are too frequent or too heavy.  Unpredictable periods.  Inability to get pregnant (infertility) because of not ovulating.  Increased growth of hair on the face, chest, stomach, back, thumbs, thighs, or toes.  Acne or oily skin. Acne may develop during adulthood, and it may not respond to treatment.  Pelvic pain.  Weight gain or obesity.  Patches of thickened and dark brown or black skin on the neck, arms, breasts, or thighs (acanthosis nigricans).  Excess hair growth on the face, chest, abdomen, or upper thighs (hirsutism). How is this diagnosed? This condition is diagnosed based on:  Your medical history.  A physical exam, including a pelvic exam. Your health care provider may look for areas of increased hair growth on your skin.  Tests, such as:  Ultrasound. This may be used to examine the ovaries and the lining of the  uterus (endometrium) for cysts.  Blood tests. These may be used to check levels of sugar (glucose), female hormone (testosterone), and female hormones (estrogen and progesterone) in your blood. How is this treated? There is no cure for PCOS, but treatment can help to manage symptoms and prevent more health problems from developing. Treatment varies depending on:  Your symptoms.  Whether you want to have a baby or whether you need birth control (contraception). Treatment may include nutrition and lifestyle changes along with:  Progesterone hormone to start a menstrual period.  Birth control pills to help you have regular menstrual periods.  Medicines to make you ovulate, if you want to get pregnant.  Medicine to reduce excessive hair growth.  Surgery, in severe cases. This may involve making small holes in one or both of your ovaries. This decreases the amount of testosterone that your body produces. Follow these instructions at home:  Take over-the-counter and prescription medicines only as told by your health care provider.  Follow a healthy meal plan. This can help you reduce the effects of PCOS.  Eat a healthy diet that includes lean proteins, complex carbohydrates, fresh fruits and vegetables, low-fat dairy products, and healthy fats. Make sure to eat enough fiber.  If you are overweight, lose weight as told by your health care provider.  Losing 10% of your body weight may improve symptoms.  Your health care provider can determine how much weight loss is best for you and can help you lose weight safely.  Keep all follow-up visits as told by your health care provider. This  is important. Contact a health care provider if:  Your symptoms do not get better with medicine.  You develop new symptoms. This information is not intended to replace advice given to you by your health care provider. Make sure you discuss any questions you have with your health care provider. Document  Released: 05/20/2004 Document Revised: 09/22/2015 Document Reviewed: 07/12/2015 Elsevier Interactive Patient Education  2017 [employer]  

## 2016-03-26 LAB — CBC WITH DIFFERENTIAL/PLATELET
BASOS: 1 %
Basophils Absolute: 0.1 10*3/uL (ref 0.0–0.2)
EOS (ABSOLUTE): 0.3 10*3/uL (ref 0.0–0.4)
Eos: 4 %
Hematocrit: 41.1 % (ref 34.0–46.6)
Hemoglobin: 13.5 g/dL (ref 11.1–15.9)
IMMATURE GRANULOCYTES: 0 %
Immature Grans (Abs): 0 10*3/uL (ref 0.0–0.1)
LYMPHS ABS: 3.3 10*3/uL — AB (ref 0.7–3.1)
Lymphs: 34 %
MCH: 29.2 pg (ref 26.6–33.0)
MCHC: 32.8 g/dL (ref 31.5–35.7)
MCV: 89 fL (ref 79–97)
MONOS ABS: 0.7 10*3/uL (ref 0.1–0.9)
Monocytes: 8 %
NEUTROS PCT: 53 %
Neutrophils Absolute: 5.1 10*3/uL (ref 1.4–7.0)
PLATELETS: 369 10*3/uL (ref 150–379)
RBC: 4.63 x10E6/uL (ref 3.77–5.28)
RDW: 13.5 % (ref 12.3–15.4)
WBC: 9.5 10*3/uL (ref 3.4–10.8)

## 2016-03-26 LAB — COMPREHENSIVE METABOLIC PANEL
A/G RATIO: 1.8 (ref 1.2–2.2)
ALK PHOS: 79 IU/L (ref 39–117)
ALT: 13 IU/L (ref 0–32)
AST: 16 IU/L (ref 0–40)
Albumin: 4.9 g/dL (ref 3.5–5.5)
BUN/Creatinine Ratio: 16 (ref 9–23)
BUN: 13 mg/dL (ref 6–20)
Bilirubin Total: 0.6 mg/dL (ref 0.0–1.2)
CALCIUM: 10 mg/dL (ref 8.7–10.2)
CHLORIDE: 98 mmol/L (ref 96–106)
CO2: 24 mmol/L (ref 18–29)
Creatinine, Ser: 0.83 mg/dL (ref 0.57–1.00)
GFR calc Af Amer: 104 mL/min/{1.73_m2} (ref >59)
GFR, EST NON AFRICAN AMERICAN: 90 mL/min/{1.73_m2} (ref >59)
Globulin, Total: 2.7 g/dL (ref 1.5–4.5)
Glucose: 112 mg/dL — ABNORMAL HIGH (ref 65–99)
POTASSIUM: 4.4 mmol/L (ref 3.5–5.2)
SODIUM: 140 mmol/L (ref 134–144)
Total Protein: 7.6 g/dL (ref 6.0–8.5)

## 2016-03-26 LAB — HEMOGLOBIN A1C
Est. average glucose Bld gHb Est-mCnc: 126 mg/dL
HEMOGLOBIN A1C: 6 % — AB (ref 4.8–5.6)

## 2016-03-26 LAB — TSH: TSH: 2.37 u[IU]/mL (ref 0.450–4.500)

## 2016-03-28 ENCOUNTER — Telehealth: Payer: Self-pay | Admitting: Emergency Medicine

## 2016-03-28 NOTE — Telephone Encounter (Signed)
Pt informed and voiced understanding of results. 

## 2016-03-28 NOTE — Telephone Encounter (Signed)
LMTCB

## 2016-03-28 NOTE — Telephone Encounter (Signed)
-----   Message from Margaretann LovelessJennifer M Burnette, PA-C sent at 03/26/2016  9:46 AM EST ----- All labs are within normal limits and stable. A1c improved to 6.0 from 6.4. Thanks! -JB

## 2016-04-10 NOTE — ED Provider Notes (Signed)
[facility]    CSN: [id number] Arrival date & time: 03/10/16  1835     History   Chief Complaint Chief Complaint  Patient presents with  . Abdominal Pain    HPI Tracey Kennedy is a 36 y.o. female.   C/o right flank/pelvic pain. Colicky in character. Denies fever. Pain can be positional.  No diarrhea.      Past Medical History:  Diagnosis Date  . Diabetes mellitus without complication (HCC)   . Hypertension     Patient Active Problem List   Diagnosis Date Noted  . Bilateral polycystic ovarian syndrome 09/11/2015  . Body mass index (BMI) of 29.0-29.9 in adult 09/11/2015  . Essential hypertension 10/20/2014  . Diabetes (HCC) 10/20/2014  . Calculus of gallbladder 04/15/2008    Past Surgical History:  Procedure Laterality Date  . CHOLECYSTECTOMY      OB History    No data available       Home Medications    Prior to Admission medications   Medication Sig Start Date End Date Taking? Authorizing Provider  CAMILA 0.35 MG tablet Take 1 tablet by mouth daily. 07/25/15  Yes Historical Provider, MD  glucose blood (ONE TOUCH ULTRA TEST) test strip Check blood glucose readings ACHS 09/21/15  Yes Alessandra Bevels Burnette, PA-C  labetalol (NORMODYNE) 200 MG tablet Take 1 tablet (200 mg total) by mouth 2 (two) times daily. 09/14/15  Yes Margaretann Loveless, PA-C  Lancets [facility] ULTRASOFT) lancets Check blood sugar ACHS 09/14/15  Yes Alessandra Bevels Burnette, PA-C  metFORMIN (GLUCOPHAGE) 500 MG tablet TAKE 1 TABLET (500 MG TOTAL) BY MOUTH 2 (TWO) TIMES DAILY. 11/03/15  Yes Alessandra Bevels Burnette, PA-C  triamterene-hydrochlorothiazide (MAXZIDE-25) 37.5-25 MG tablet Take 1 tablet by mouth daily. 09/14/15  Yes Margaretann Loveless, PA-C  Alcohol Swabs (ALCOHOL PREP) PADS Use one pad to cleanse skin prior to checking blood sugar ACHS 09/14/15   Margaretann Loveless, PA-C    Family History History reviewed. No pertinent family history.  Social History Social History  Substance  Use Topics  . Smoking status: Never Smoker  . Smokeless tobacco: Never Used  . Alcohol use No     Allergies   Penicillins   Review of Systems Review of Systems  Constitutional: Negative for chills and fever.  HENT: Negative for sore throat and tinnitus.   Eyes: Negative for redness.  Respiratory: Negative for cough and shortness of breath.   Cardiovascular: Negative for chest pain and palpitations.  Gastrointestinal: Positive for abdominal pain and nausea. Negative for diarrhea and vomiting.  Genitourinary: Negative for dysuria, frequency and urgency.  Musculoskeletal: Negative for myalgias.  Skin: Negative for rash.       No lesions  Neurological: Negative for weakness.  Hematological: Does not bruise/bleed easily.  Psychiatric/Behavioral: Negative for suicidal ideas.     Physical Exam Triage Vital Signs ED Triage Vitals  Enc Vitals Group     BP 03/10/16 1852 (!) 143/103     Pulse Rate 03/10/16 1852 78     Resp 03/10/16 1852 16     Temp 03/10/16 1852 98.3 F (36.8 C)     Temp Source 03/10/16 1852 Oral     SpO2 03/10/16 1852 99 %     Weight 03/10/16 1854 165 lb (74.8 kg)     Height 03/10/16 1854 5\' 3"  (1.6 m)     Head Circumference --      Peak Flow --      Pain Score 03/10/16 1856 8  Pain Loc --      Pain Edu? --      Excl. in GC? --    No data found.   Updated Vital Signs BP (!) 143/103 (BP Location: Left Arm)   Pulse 78   Temp 98.3 F (36.8 C) (Oral)   Resp 16   Ht 5\' 3"  (1.6 m)   Wt 165 lb (74.8 kg)   LMP 02/25/2016   SpO2 99%   BMI 29.23 kg/m   Visual Acuity Right Eye Distance:   Left Eye Distance:   Bilateral Distance:    Right Eye Near:   Left Eye Near:    Bilateral Near:     Physical Exam  Constitutional: She is oriented to person, place, and time. She appears well-developed and well-nourished. No distress.  HENT:  Head: Normocephalic and atraumatic.  Mouth/Throat: Oropharynx is clear and moist.  Eyes: Conjunctivae and EOM are  normal. Pupils are equal, round, and reactive to light. No scleral icterus.  Neck: Normal range of motion. Neck supple. No JVD present. No tracheal deviation present. No thyromegaly present.  Cardiovascular: Normal rate, regular rhythm and normal heart sounds.  Exam reveals no gallop and no friction rub.   No murmur heard. Pulmonary/Chest: Effort normal and breath sounds normal.  Abdominal: Soft. Bowel sounds are normal. She exhibits no distension and no mass. There is tenderness. There is rebound and guarding. No hernia.  Musculoskeletal: Normal range of motion. She exhibits no edema.  Lymphadenopathy:    She has no cervical adenopathy.  Neurological: She is alert and oriented to person, place, and time. No cranial nerve deficit.  Skin: Skin is warm and dry.  Psychiatric: She has a normal mood and affect. Her behavior is normal. Judgment and thought content normal.  Nursing note and vitals reviewed.    UC Treatments / Results  Labs (all labs ordered are listed, but only abnormal results are displayed) Labs Reviewed  URINALYSIS, COMPLETE (UACMP) WITH MICROSCOPIC - Abnormal; Notable for the following:       Result Value   Squamous Epithelial / LPF 6-30 (*)    Bacteria, UA FEW (*)    All other components within normal limits    EKG  EKG Interpretation None       Radiology No results found.  Procedures Procedures (including critical care time)  Medications Ordered in UC Medications - No data to display   Initial Impression / Assessment and Plan / UC Course  I have reviewed the triage vital signs and the nursing notes.  Pertinent labs & imaging results that were available during my care of the patient were reviewed by me and considered in my medical decision making (see chart for details).     Symptoms not c/w pyelonephritis. More likely ovarian cyst.  Refer to OB/GYN  Final Clinical Impressions(s) / UC Diagnoses   Final diagnoses:  Cyst of right ovary    New  Prescriptions Discharge Medication List as of 03/10/2016  7:45 PM       Arnaldo NatalMichael S Diamond, MD 04/10/16 1646

## 2016-05-03 ENCOUNTER — Other Ambulatory Visit: Payer: Self-pay | Admitting: Physician Assistant

## 2016-05-03 DIAGNOSIS — E119 Type 2 diabetes mellitus without complications: Secondary | ICD-10-CM

## 2016-05-03 NOTE — Telephone Encounter (Signed)
Last ov  03/25/16 Last filled 11/03/15 Please review. Thank you. sd

## 2016-09-12 ENCOUNTER — Ambulatory Visit (INDEPENDENT_AMBULATORY_CARE_PROVIDER_SITE_OTHER): Payer: BC Managed Care – PPO | Admitting: Physician Assistant

## 2016-09-12 ENCOUNTER — Encounter: Payer: Self-pay | Admitting: Physician Assistant

## 2016-09-12 VITALS — BP 128/80 | HR 86 | Temp 98.3°F | Resp 16 | Wt 170.5 lb

## 2016-09-12 DIAGNOSIS — I10 Essential (primary) hypertension: Secondary | ICD-10-CM | POA: Diagnosis not present

## 2016-09-12 DIAGNOSIS — E119 Type 2 diabetes mellitus without complications: Secondary | ICD-10-CM | POA: Diagnosis not present

## 2016-09-12 DIAGNOSIS — E282 Polycystic ovarian syndrome: Secondary | ICD-10-CM | POA: Diagnosis not present

## 2016-09-12 DIAGNOSIS — K219 Gastro-esophageal reflux disease without esophagitis: Secondary | ICD-10-CM | POA: Diagnosis not present

## 2016-09-12 DIAGNOSIS — Z136 Encounter for screening for cardiovascular disorders: Secondary | ICD-10-CM

## 2016-09-12 DIAGNOSIS — Z13220 Encounter for screening for lipoid disorders: Secondary | ICD-10-CM | POA: Diagnosis not present

## 2016-09-12 LAB — POCT GLYCOSYLATED HEMOGLOBIN (HGB A1C)
Est. average glucose Bld gHb Est-mCnc: 140
Hemoglobin A1C: 6.5

## 2016-09-12 MED ORDER — PANTOPRAZOLE SODIUM 40 MG PO TBEC: 40.0000 mg | DELAYED_RELEASE_TABLET | Freq: Every day | ORAL | 0 refills | Status: DC

## 2016-09-12 MED ORDER — METFORMIN HCL 500 MG PO TABS: 500.0000 mg | ORAL_TABLET | Freq: Two times a day (BID) | ORAL | 3 refills | Status: DC

## 2016-09-12 MED ORDER — LABETALOL HCL 200 MG PO TABS: 200.0000 mg | ORAL_TABLET | Freq: Two times a day (BID) | ORAL | 3 refills | Status: DC

## 2016-09-12 MED ORDER — TRIAMTERENE-HCTZ 37.5-25 MG PO TABS: 1.0000 | ORAL_TABLET | Freq: Every day | ORAL | 3 refills | Status: DC

## 2016-09-12 NOTE — Patient Instructions (Signed)
Food Choices for Gastroesophageal Reflux Disease, Adult When you have gastroesophageal reflux disease (GERD), the foods you eat and your eating habits are very important. Choosing the right foods can help ease the discomfort of GERD. Consider working with a diet and nutrition specialist (dietitian) to help you make healthy food choices. What general guidelines should I follow? Eating plan  Choose healthy foods low in fat, such as fruits, vegetables, whole grains, low-fat dairy products, and lean meat, fish, and poultry.  Eat frequent, small meals instead of three large meals each day. Eat your meals slowly, in a relaxed setting. Avoid bending over or lying down until 2-3 hours after eating.  Limit high-fat foods such as fatty meats or fried foods.  Limit your intake of oils, butter, and shortening to less than 8 teaspoons each day.  Avoid the following: ? Foods that cause symptoms. These may be different for different people. Keep a food diary to keep track of foods that cause symptoms. ? Alcohol. ? Drinking large amounts of liquid with meals. ? Eating meals during the 2-3 hours before bed.  Cook foods using methods other than frying. This may include baking, grilling, or broiling. Lifestyle   Maintain a healthy weight. Ask your health care provider what weight is healthy for you. If you need to lose weight, work with your health care provider to do so safely.  Exercise for at least 30 minutes on 5 or more days each week, or as told by your health care provider.  Avoid wearing clothes that fit tightly around your waist and chest.  Do not use any products that contain nicotine or tobacco, such as cigarettes and e-cigarettes. If you need help quitting, ask your health care provider.  Sleep with the head of your bed raised. Use a wedge under the mattress or blocks under the bed frame to raise the head of the bed. What foods are not recommended? The items listed may not be a complete  list. Talk with your dietitian about what dietary choices are best for you. Grains Pastries or quick breads with added fat. French toast. Vegetables Deep fried vegetables. French fries. Any vegetables prepared with added fat. Any vegetables that cause symptoms. For some people this may include tomatoes and tomato products, chili peppers, onions and garlic, and horseradish. Fruits Any fruits prepared with added fat. Any fruits that cause symptoms. For some people this may include citrus fruits, such as oranges, grapefruit, pineapple, and lemons. Meats and other protein foods High-fat meats, such as fatty beef or pork, hot dogs, ribs, ham, sausage, salami and bacon. Fried meat or protein, including fried fish and fried chicken. Nuts and nut butters. Dairy Whole milk and chocolate milk. Sour cream. Cream. Ice cream. Cream cheese. Milk shakes. Beverages Coffee and tea, with or without caffeine. Carbonated beverages. Sodas. Energy drinks. Fruit juice made with acidic fruits (such as [place] or grapefruit). Tomato juice. Alcoholic drinks. Fats and oils Butter. Margarine. Shortening. Ghee. Sweets and desserts Chocolate and cocoa. Donuts. Seasoning and other foods Pepper. Peppermint and spearmint. Any condiments, herbs, or seasonings that cause symptoms. For some people, this may include curry, hot sauce, or vinegar-based salad dressings. Summary  When you have gastroesophageal reflux disease (GERD), food and lifestyle choices are very important to help ease the discomfort of GERD.  Eat frequent, small meals instead of three large meals each day. Eat your meals slowly, in a relaxed setting. Avoid bending over or lying down until 2-3 hours after eating.  Limit high-fat   foods such as fatty meat or fried foods. This information is not intended to replace advice given to you by your health care provider. Make sure you discuss any questions you have with your health care provider. Document Released:  01/24/2005 Document Revised: 01/26/2016 Document Reviewed: 01/26/2016 Elsevier Interactive Patient Education  2017 Elsevier Inc. Heartburn Heartburn is a type of pain or discomfort that can happen in the throat or chest. It is often described as a burning pain. It may also cause a bad taste in the mouth. Heartburn may feel worse when you lie down or bend over, and it is often worse at night. Heartburn may be caused by stomach contents that move back up into the esophagus (reflux). Follow these instructions at home: Take these actions to decrease your discomfort and to help avoid complications. Diet  Follow a diet as recommended by your health care provider. This may involve avoiding foods and drinks such as: ? Coffee and tea (with or without caffeine). ? Drinks that contain alcohol. ? Energy drinks and sports drinks. ? Carbonated drinks or sodas. ? Chocolate and cocoa. ? Peppermint and mint flavorings. ? Garlic and onions. ? Horseradish. ? Spicy and acidic foods, including peppers, chili powder, curry powder, vinegar, hot sauces, and barbecue sauce. ? Citrus fruit juices and citrus fruits, such as oranges, lemons, and limes. ? Tomato-based foods, such as red sauce, chili, salsa, and pizza with red sauce. ? Fried and fatty foods, such as donuts, french fries, potato chips, and high-fat dressings. ? High-fat meats, such as hot dogs and fatty cuts of red and white meats, such as rib eye steak, sausage, ham, and bacon. ? High-fat dairy items, such as whole milk, butter, and cream cheese.  Eat small, frequent meals instead of large meals.  Avoid drinking large amounts of liquid with your meals.  Avoid eating meals during the 2-3 hours before bedtime.  Avoid lying down right after you eat.  Do not exercise right after you eat. General instructions  Pay attention to any changes in your symptoms.  Take over-the-counter and prescription medicines only as told by your health care  provider. Do not take aspirin, ibuprofen, or other NSAIDs unless your health care provider told you to do so.  Do not use any tobacco products, including cigarettes, chewing tobacco, and e-cigarettes. If you need help quitting, ask your health care provider.  Wear loose-fitting clothing. Do not wear anything tight around your waist that causes pressure on your abdomen.  Raise (elevate) the head of your bed about 6 inches (15 cm).  Try to reduce your stress, such as with yoga or meditation. If you need help reducing stress, ask your health care provider.  If you are overweight, reduce your weight to an amount that is healthy for you. Ask your health care provider for guidance about a safe weight loss goal.  Keep all follow-up visits as told by your health care provider. This is important. Contact a health care provider if:  You have new symptoms.  You have unexplained weight loss.  You have difficulty swallowing, or it hurts to swallow.  You have wheezing or a persistent cough.  Your symptoms do not improve with treatment.  You have frequent heartburn for more than two weeks. Get help right away if:  You have pain in your arms, neck, jaw, teeth, or back.  You feel sweaty, dizzy, or light-headed.  You have chest pain or shortness of breath.  You vomit and your vomit looks like blood  or coffee grounds.  Your stool is bloody or black. This information is not intended to replace advice given to you by your health care provider. Make sure you discuss any questions you have with your health care provider. Document Released: 06/12/2008 Document Revised: 07/02/2015 Document Reviewed: 05/21/2014 Elsevier Interactive Patient Education  2017 [employer]

## 2016-09-12 NOTE — Progress Notes (Signed)
Patient: Tracey Kennedy Female    DOB: 15-Mar-1980   36 y.o.   MRN: [id number] Visit Date: 09/12/2016  Today's Provider: Margaretann Loveless, PA-C   Chief Complaint  Patient presents with  . Follow-up   Subjective:    HPI  Hypertension, follow-up:  BP Readings from Last 3 Encounters:  09/12/16 128/80  03/25/16 128/90  03/10/16 (!) 143/103    She was last seen for hypertension 6 months ago.  BP at that visit was 128/90. Management since that visit includes none. She reports excellent compliance with treatment. She is not having side effects.  She is exercising. She is adherent to low salt diet.   She is experiencing none.  Patient denies chest pain, claudication, exertional chest pressure/discomfort, fatigue, irregular heart beat, lower extremity edema and palpitations.   Cardiovascular risk factors include diabetes mellitus and hypertension.    Weight trend: stable Wt Readings from Last 3 Encounters:  09/12/16 170 lb 8 oz (77.3 kg)  03/25/16 166 lb 6.4 oz (75.5 kg)  03/10/16 165 lb (74.8 kg)    Current diet: well balanced  ------------------------------------------------------------------------  Diabetes Mellitus Type II, Follow-up:   Lab Results  Component Value Date   HGBA1C 6.5 09/12/2016   HGBA1C 6.0 (H) 03/25/2016   HGBA1C 6.4 09/14/2015    Last seen for diabetes 6 months ago.  Management since then includes none. She reports excellent compliance with treatment. She is not having side effects.  Current symptoms include none and have been stable. Home blood sugar records: 110's-120's  Episodes of hypoglycemia? no   Current Regimen: Metformin 500mg , take 1 tablet by mouth 2 times daily. Most Recent Eye Exam:   Pertinent Labs:    Component Value Date/Time   CREATININE 0.83 03/25/2016 1641   CREATININE 0.78 04/17/2012 0139    Wt Readings from Last 3 Encounters:  09/12/16 170 lb 8 oz (77.3 kg)  03/25/16 166 lb 6.4 oz (75.5 kg)    03/10/16 165 lb (74.8 kg)    ------------------------------------------------------------------------     Allergies  Allergen Reactions  . Penicillins      Current Outpatient Prescriptions:  .  Alcohol Swabs (ALCOHOL PREP) PADS, Use one pad to cleanse skin prior to checking blood sugar ACHS, Disp: 100 each, Rfl: 12 .  CAMILA 0.35 MG tablet, Take 1 tablet by mouth daily., Disp: , Rfl: 3 .  glucose blood (ONE TOUCH ULTRA TEST) test strip, Check blood glucose readings ACHS, Disp: 100 each, Rfl: 12 .  labetalol (NORMODYNE) 200 MG tablet, Take 1 tablet (200 mg total) by mouth 2 (two) times daily., Disp: 180 tablet, Rfl: 3 .  Lancets (ONETOUCH ULTRASOFT) lancets, Check blood sugar ACHS, Disp: 100 each, Rfl: 12 .  metFORMIN (GLUCOPHAGE) 500 MG tablet, TAKE 1 TABLET (500 MG TOTAL) BY MOUTH 2 (TWO) TIMES DAILY., Disp: 60 tablet, Rfl: 5 .  triamterene-hydrochlorothiazide (MAXZIDE-25) 37.5-25 MG tablet, Take 1 tablet by mouth daily., Disp: 90 tablet, Rfl: 3  Review of Systems  Constitutional: Negative.   Respiratory: Negative.   Cardiovascular: Negative.   Gastrointestinal: Positive for abdominal distention, abdominal pain, nausea and vomiting.  Endocrine: Negative.   Genitourinary: Negative for dysuria, flank pain, genital sores, hematuria, menstrual problem, pelvic pain and urgency.  Musculoskeletal: Negative for back pain.  Neurological: Negative.     Social History  Substance Use Topics  . Smoking status: Never Smoker  . Smokeless tobacco: Never Used  . Alcohol use No   Objective:   BP  128/80 (BP Location: Right Arm, Patient Position: Sitting, Cuff Size: Normal)   Pulse 86   Temp 98.3 F (36.8 C) (Oral)   Resp 16   Wt 170 lb 8 oz (77.3 kg)   BMI 30.20 kg/m    Physical Exam  Constitutional: She is oriented to person, place, and time. She appears well-developed and well-nourished. No distress.  Neck: Normal range of motion. Neck supple. No JVD present. No tracheal  deviation present. No thyromegaly present.  Cardiovascular: Normal rate, regular rhythm and normal heart sounds.  Exam reveals no gallop and no friction rub.   No murmur heard. Pulmonary/Chest: Effort normal and breath sounds normal. No respiratory distress. She has no wheezes. She has no rales.  Abdominal: Soft. Normal appearance and bowel sounds are normal. She exhibits no distension and no mass. There is no hepatosplenomegaly. There is tenderness in the epigastric area. There is no rebound, no guarding and no CVA tenderness.  Lymphadenopathy:    She has no cervical adenopathy.  Neurological: She is alert and oriented to person, place, and time.  Skin: Skin is warm and dry. She is not diaphoretic.  Vitals reviewed.       Assessment & Plan:     1. Essential hypertension BP stable and well controlled today. Continue labetolol and Maxzide as prescribed. Will check labs as below and f/u pending results. I will see her back in 6 months for CPE.  - labetalol (NORMODYNE) 200 MG tablet; Take 1 tablet (200 mg total) by mouth 2 (two) times daily.  Dispense: 180 tablet; Refill: 3 - triamterene-hydrochlorothiazide (MAXZIDE-25) 37.5-25 MG tablet; Take 1 tablet by mouth daily.  Dispense: 90 tablet; Refill: 3 - CBC with Differential - Comprehensive Metabolic Panel (CMET) - Lipid Profile  2. Type 2 diabetes mellitus without complication, unspecified whether long term insulin use (HCC) A1c increased to 6.5 from 6.0. Continue metformin 500mg  BID. Work on lifestyle modifications. Will check labs as below and f/u pending results. I will see her back in 6 months.  - POCT glycosylated hemoglobin (Hb A1C) - metFORMIN (GLUCOPHAGE) 500 MG tablet; Take 1 tablet (500 mg total) by mouth 2 (two) times daily.  Dispense: 180 tablet; Refill: 3 - CBC with Differential - Comprehensive Metabolic Panel (CMET) - Lipid Profile  3. Gastroesophageal reflux disease, esophagitis presence not specified Worsening and not  responding as well to Ranitidine. Will add Protonix as below. She is to use for 3 months then discontinue. She is to call the office if no symptom improvement in 2-4 weeks.  - pantoprazole (PROTONIX) 40 MG tablet; Take 1 tablet (40 mg total) by mouth daily.  Dispense: 90 tablet; Refill: 0  4. Bilateral polycystic ovarian syndrome Stable. Diagnosis pulled for medication refill. Continue current medical treatment plan. - metFORMIN (GLUCOPHAGE) 500 MG tablet; Take 1 tablet (500 mg total) by mouth 2 (two) times daily.  Dispense: 180 tablet; Refill: 3  5. Encounter for lipid screening for cardiovascular disease Will check labs as below and f/u pending results. - Lipid Profile       Margaretann LovelessJennifer M Katriel Cutsforth, PA-C  [facility]

## 2016-09-13 ENCOUNTER — Telehealth: Payer: Self-pay

## 2016-09-13 LAB — COMPREHENSIVE METABOLIC PANEL
ALK PHOS: 67 IU/L (ref 39–117)
ALT: 27 IU/L (ref 0–32)
AST: 22 IU/L (ref 0–40)
Albumin/Globulin Ratio: 1.9 (ref 1.2–2.2)
Albumin: 4.7 g/dL (ref 3.5–5.5)
BILIRUBIN TOTAL: 0.6 mg/dL (ref 0.0–1.2)
BUN/Creatinine Ratio: 19 (ref 9–23)
BUN: 14 mg/dL (ref 6–20)
CO2: 22 mmol/L (ref 20–29)
Calcium: 9.5 mg/dL (ref 8.7–10.2)
Chloride: 100 mmol/L (ref 96–106)
Creatinine, Ser: 0.75 mg/dL (ref 0.57–1.00)
GFR calc Af Amer: 118 mL/min/{1.73_m2} (ref >59)
GFR calc non Af Amer: 102 mL/min/{1.73_m2} (ref >59)
Globulin, Total: 2.5 g/dL (ref 1.5–4.5)
Glucose: 122 mg/dL — ABNORMAL HIGH (ref 65–99)
Potassium: 4.6 mmol/L (ref 3.5–5.2)
Sodium: 140 mmol/L (ref 134–144)
Total Protein: 7.2 g/dL (ref 6.0–8.5)

## 2016-09-13 LAB — CBC WITH DIFFERENTIAL/PLATELET
BASOS ABS: 0.1 10*3/uL (ref 0.0–0.2)
Basos: 1 %
EOS (ABSOLUTE): 0.2 10*3/uL (ref 0.0–0.4)
Eos: 3 %
Hematocrit: 41.6 % (ref 34.0–46.6)
Hemoglobin: 13.8 g/dL (ref 11.1–15.9)
Immature Grans (Abs): 0 10*3/uL (ref 0.0–0.1)
Immature Granulocytes: 0 %
LYMPHS ABS: 2.7 10*3/uL (ref 0.7–3.1)
LYMPHS: 31 %
MCH: 29.4 pg (ref 26.6–33.0)
MCHC: 33.2 g/dL (ref 31.5–35.7)
MCV: 89 fL (ref 79–97)
Monocytes Absolute: 0.4 10*3/uL (ref 0.1–0.9)
Monocytes: 5 %
NEUTROS ABS: 5 10*3/uL (ref 1.4–7.0)
Neutrophils: 60 %
PLATELETS: 337 10*3/uL (ref 150–379)
RBC: 4.7 x10E6/uL (ref 3.77–5.28)
RDW: 13.4 % (ref 12.3–15.4)
WBC: 8.5 10*3/uL (ref 3.4–10.8)

## 2016-09-13 LAB — LIPID PANEL
CHOLESTEROL TOTAL: 201 mg/dL — AB (ref 100–199)
Chol/HDL Ratio: 3.9 ratio (ref 0.0–4.4)
HDL: 51 mg/dL (ref >39)
LDL Calculated: 129 mg/dL — ABNORMAL HIGH (ref 0–99)
Triglycerides: 103 mg/dL (ref 0–149)
VLDL CHOLESTEROL CAL: 21 mg/dL (ref 5–40)

## 2016-09-13 NOTE — Telephone Encounter (Signed)
-----   Message from Margaretann LovelessJennifer M Burnette, PA-C sent at 09/13/2016  8:32 AM EDT ----- Cholesterol is borderline high. Recommend focusing on lifestyle modifications and trying to get back on track with healthy dieting and increasing your physical activity again. All other labs are WNL.

## 2016-09-13 NOTE — Telephone Encounter (Signed)
Patient advised as below.  

## 2016-09-25 ENCOUNTER — Other Ambulatory Visit: Payer: Self-pay | Admitting: Physician Assistant

## 2016-09-25 DIAGNOSIS — I10 Essential (primary) hypertension: Secondary | ICD-10-CM

## 2017-01-08 ENCOUNTER — Other Ambulatory Visit: Payer: Self-pay | Admitting: Physician Assistant

## 2017-01-08 DIAGNOSIS — K219 Gastro-esophageal reflux disease without esophagitis: Secondary | ICD-10-CM

## 2017-02-17 ENCOUNTER — Ambulatory Visit: Payer: BC Managed Care – PPO | Admitting: Family Medicine

## 2017-02-17 ENCOUNTER — Encounter: Payer: Self-pay | Admitting: Family Medicine

## 2017-02-17 VITALS — BP 120/84 | HR 119 | Temp 98.9°F | Resp 16 | Wt 177.4 lb

## 2017-02-17 DIAGNOSIS — J020 Streptococcal pharyngitis: Secondary | ICD-10-CM | POA: Diagnosis not present

## 2017-02-17 LAB — POCT RAPID STREP A (OFFICE): RAPID STREP A SCREEN: POSITIVE — AB

## 2017-02-17 MED ORDER — AZITHROMYCIN 250 MG PO TABS: ORAL_TABLET | ORAL | 0 refills | Status: DC

## 2017-02-17 NOTE — Patient Instructions (Signed)
Discussed use of ibuprofen and salt water gargles.

## 2017-02-17 NOTE — Progress Notes (Signed)
Subjective:     Patient ID: Tracey PickupStephanie D Mumaw, female   DOB: 04-17-1980, 37 y.o.   MRN: [id number] Chief Complaint  Patient presents with  . Sore Throat    Patient comes in office today with complaints of sore throat, fever high of 100.1 and body aches for less than 24hrs. Patient states that she has been exposed to strep by her spouse.    HPI States they have a 39 year old at home who is not ill.  Review of Systems     Objective:   Physical Exam  Constitutional: She appears well-developed and well-nourished. No distress.  Ears: T.M's intact without inflammation Throat: moderate tonsillar enlargement with erythema and exudate Neck: bilateral, tender, anterior cervical nodes Lungs: clear     Assessment:    1. Pharyngitis due to Streptococcus species - POCT rapid strep A - azithromycin (ZITHROMAX Z-PAK) 250 MG tablet; 2 pills today then one pill daily for four days  Dispense: 6 each; Refill: 0    Plan:    Discussed ibuprofen and salt water gargles.

## 2017-03-16 ENCOUNTER — Encounter: Payer: BC Managed Care – PPO | Admitting: Physician Assistant

## 2017-03-24 ENCOUNTER — Ambulatory Visit (INDEPENDENT_AMBULATORY_CARE_PROVIDER_SITE_OTHER): Payer: BC Managed Care – PPO | Admitting: Physician Assistant

## 2017-03-24 ENCOUNTER — Encounter: Payer: Self-pay | Admitting: Physician Assistant

## 2017-03-24 ENCOUNTER — Other Ambulatory Visit: Payer: Self-pay

## 2017-03-24 VITALS — BP 120/80 | HR 83 | Temp 98.3°F | Resp 16 | Ht 63.0 in | Wt 171.0 lb

## 2017-03-24 DIAGNOSIS — Z0000 Encounter for general adult medical examination without abnormal findings: Secondary | ICD-10-CM | POA: Diagnosis not present

## 2017-03-24 DIAGNOSIS — Z13220 Encounter for screening for lipoid disorders: Secondary | ICD-10-CM | POA: Diagnosis not present

## 2017-03-24 DIAGNOSIS — Z136 Encounter for screening for cardiovascular disorders: Secondary | ICD-10-CM | POA: Diagnosis not present

## 2017-03-24 DIAGNOSIS — E119 Type 2 diabetes mellitus without complications: Secondary | ICD-10-CM

## 2017-03-24 DIAGNOSIS — I10 Essential (primary) hypertension: Secondary | ICD-10-CM

## 2017-03-24 NOTE — Patient Instructions (Signed)

## 2017-03-24 NOTE — Progress Notes (Addendum)
Patient: Armen PickupStephanie D Roza, Female    DOB: 24-Aug-1980, 37 y.o.   MRN: [id number] Visit Date: 03/24/2017  Today's Provider: Margaretann LovelessJennifer M Bernedette Auston, PA-C   Chief Complaint  Patient presents with  . Annual Exam   Subjective:    Annual physical exam Armen PickupStephanie D Ruderman is a 37 y.o. female who presents today for health maintenance and complete physical. She feels well. She reports exercising. She reports she is sleeping well.  [facility]. WUJ:[id number]:Declined. Eye Exam: Last year - she is due. -----------------------------------------------------------------   Review of Systems  Constitutional: Negative.   HENT: Negative.   Eyes: Negative.   Respiratory: Negative.   Cardiovascular: Negative.   Gastrointestinal: Negative.   Endocrine: Negative.   Genitourinary: Negative.   Musculoskeletal: Negative.   Skin: Negative.   Allergic/Immunologic: Negative.   Neurological: Negative.   Hematological: Negative.   Psychiatric/Behavioral: Negative.     Social History      She  reports that  has never smoked. she has never used smokeless tobacco. She reports that she does not drink alcohol or use drugs.       Social History   Socioeconomic History  . Marital status: Married    Spouse name: None  . Number of children: None  . Years of education: None  . Highest education level: None  Social Needs  . Financial resource strain: None  . Food insecurity - worry: None  . Food insecurity - inability: None  . Transportation needs - medical: None  . Transportation needs - non-medical: None  Occupational History  . None  Tobacco Use  . Smoking status: Never Smoker  . Smokeless tobacco: Never Used  Substance and Sexual Activity  . Alcohol use: No  . Drug use: No  . Sexual activity: Yes  Other Topics Concern  . None  Social History Narrative  . None    Past Medical History:  Diagnosis Date  . Diabetes mellitus without  complication (HCC)   . Hypertension      Patient Active Problem List   Diagnosis Date Noted  . Bilateral polycystic ovarian syndrome 09/11/2015  . Body mass index (BMI) of 29.0-29.9 in adult 09/11/2015  . Essential hypertension 10/20/2014  . Diabetes (HCC) 10/20/2014  . Calculus of gallbladder 04/15/2008    Past Surgical History:  Procedure Laterality Date  . CHOLECYSTECTOMY      Family History        Family Status  Relation Name Status  . Mother  Deceased       2008 of lung cancer  . Father  Alive        Her family history is not on file.    Allergies  Allergen Reactions  . Penicillins      Current Outpatient Medications:  .  CAMILA 0.35 MG tablet, Take 1 tablet by mouth daily., Disp: , Rfl: 3 .  glucose blood (ONE TOUCH ULTRA TEST) test strip, Check blood glucose readings ACHS, Disp: 100 each, Rfl: 12 .  labetalol (NORMODYNE) 200 MG tablet, Take 1 tablet (200 mg total) by mouth 2 (two) times daily., Disp: 180 tablet, Rfl: 3 .  Lancets (ONETOUCH ULTRASOFT) lancets, Check blood sugar ACHS, Disp: 100 each, Rfl: 12 .  metFORMIN (GLUCOPHAGE) 500 MG tablet, Take 1 tablet (500 mg total) by mouth 2 (two) times daily., Disp: 180 tablet, Rfl: 3 .  pantoprazole (PROTONIX) 40 MG tablet, TAKE 1 TABLET BY MOUTH EVERY DAY, Disp: 90  tablet, Rfl: 1 .  triamterene-hydrochlorothiazide (MAXZIDE-25) 37.5-25 MG tablet, TAKE 1 TABLET BY MOUTH DAILY., Disp: 90 tablet, Rfl: 3   Patient Care Team: Margaretann Loveless, PA-C as PCP - General (Family Medicine)      Objective:   Vitals: BP 120/80 (BP Location: Left Arm, Patient Position: Sitting, Cuff Size: Normal)   Pulse 83   Temp 98.3 F (36.8 C) (Oral)   Resp 16   Ht 5\' 3"  (1.6 m)   Wt 171 lb (77.6 kg)   LMP 03/10/2017   SpO2 98%   BMI 30.29 kg/m    Physical Exam  Constitutional: She is oriented to person, place, and time. She appears well-developed and well-nourished. No distress.  HENT:  Head: Normocephalic and  atraumatic.  Right Ear: Hearing, tympanic membrane, external ear and ear canal normal.  Left Ear: Hearing, tympanic membrane, external ear and ear canal normal.  Nose: Nose normal.  Mouth/Throat: Uvula is midline, oropharynx is clear and moist and mucous membranes are normal. No oropharyngeal exudate.  Eyes: Conjunctivae and EOM are normal. Pupils are equal, round, and reactive to light. Right eye exhibits no discharge. Left eye exhibits no discharge. No scleral icterus.  Neck: Normal range of motion. Neck supple. No JVD present. No tracheal deviation present. No thyromegaly present.  Cardiovascular: Normal rate, regular rhythm, normal heart sounds and intact distal pulses. Exam reveals no gallop and no friction rub.  No murmur heard. Pulmonary/Chest: Effort normal and breath sounds normal. No respiratory distress. She has no wheezes. She has no rales. She exhibits no tenderness.  Abdominal: Soft. Bowel sounds are normal. She exhibits no distension and no mass. There is no tenderness. There is no rebound and no guarding.  Musculoskeletal: Normal range of motion. She exhibits no edema or tenderness.  Lymphadenopathy:    She has no cervical adenopathy.  Neurological: She is alert and oriented to person, place, and time.  Skin: Skin is warm and dry. No rash noted. She is not diaphoretic.  Psychiatric: She has a normal mood and affect. Her behavior is normal. Judgment and thought content normal.  Vitals reviewed.  Diabetic Foot Exam - Simple   Simple Foot Form Diabetic Foot exam was performed with the following findings:  Yes 03/24/2017 11:28 AM  Visual Inspection No deformities, no ulcerations, no other skin breakdown bilaterally:  Yes Sensation Testing Intact to touch and monofilament testing bilaterally:  Yes Pulse Check Posterior Tibialis and Dorsalis pulse intact bilaterally:  Yes Comments    Depression Screen PHQ 2/9 Scores 03/24/2017 09/12/2016  PHQ - 2 Score 0 0  PHQ- 9 Score - 0       Assessment & Plan:     Routine Health Maintenance and Physical Exam  Exercise Activities and Dietary recommendations Goals    None       There is no immunization history on file for this patient.  Health Maintenance  Topic Date Due  . PNEUMOCOCCAL POLYSACCHARIDE VACCINE (1) 10/29/1980  . FOOT EXAM  10/29/1988  . OPHTHALMOLOGY EXAM  10/29/1988  . URINE MICROALBUMIN  10/29/1988  . HIV Screening  10/29/1993  . TETANUS/TDAP  10/29/1997  . PAP SMEAR  10/30/1999  . HEMOGLOBIN A1C  03/15/2017  . INFLUENZA VACCINE  05/08/2018 (Originally 09/07/2016)     Discussed health benefits of physical activity, and encouraged her to engage in regular exercise appropriate for her age and condition.    1. Annual physical exam Normal physical exam today. Will check labs as below and f/u pending  lab results. If labs are stable and WNL she will not need to have these rechecked for one year at her next annual physical exam. She is to call the office in the meantime if she has any acute issue, questions or concerns. - CBC with Differential/Platelet - Comprehensive metabolic panel - TSH  2. Essential hypertension Stable on Maxzide and Labetalol. Will check labs as below and f/u pending results.  3. Type 2 diabetes mellitus without complication, unspecified whether long term insulin use (HCC) Stable on Metformin 500mg  once daily. Will check labs as below and f/u pending results. - Hemoglobin A1c  4. Encounter for lipid screening for cardiovascular disease Will check labs as below and f/u pending results. - Lipid panel  --------------------------------------------------------------------    Margaretann Loveless, PA-C  [facility]

## 2017-03-25 LAB — COMPREHENSIVE METABOLIC PANEL
A/G RATIO: 1.9 (ref 1.2–2.2)
ALK PHOS: 62 IU/L (ref 39–117)
ALT: 20 IU/L (ref 0–32)
AST: 18 IU/L (ref 0–40)
Albumin: 4.5 g/dL (ref 3.5–5.5)
BILIRUBIN TOTAL: 0.7 mg/dL (ref 0.0–1.2)
BUN/Creatinine Ratio: 14 (ref 9–23)
BUN: 10 mg/dL (ref 6–20)
CALCIUM: 9.5 mg/dL (ref 8.7–10.2)
CHLORIDE: 102 mmol/L (ref 96–106)
CO2: 21 mmol/L (ref 20–29)
Creatinine, Ser: 0.73 mg/dL (ref 0.57–1.00)
GFR calc Af Amer: 121 mL/min/{1.73_m2} (ref >59)
GFR calc non Af Amer: 105 mL/min/{1.73_m2} (ref >59)
Globulin, Total: 2.4 g/dL (ref 1.5–4.5)
Glucose: 115 mg/dL — ABNORMAL HIGH (ref 65–99)
POTASSIUM: 4 mmol/L (ref 3.5–5.2)
Sodium: 139 mmol/L (ref 134–144)
Total Protein: 6.9 g/dL (ref 6.0–8.5)

## 2017-03-25 LAB — CBC WITH DIFFERENTIAL/PLATELET
BASOS: 1 %
Basophils Absolute: 0.1 10*3/uL (ref 0.0–0.2)
EOS (ABSOLUTE): 0.2 10*3/uL (ref 0.0–0.4)
Eos: 3 %
Hematocrit: 38.3 % (ref 34.0–46.6)
Hemoglobin: 12.9 g/dL (ref 11.1–15.9)
IMMATURE GRANULOCYTES: 0 %
Immature Grans (Abs): 0 10*3/uL (ref 0.0–0.1)
Lymphocytes Absolute: 2.9 10*3/uL (ref 0.7–3.1)
Lymphs: 35 %
MCH: 29.2 pg (ref 26.6–33.0)
MCHC: 33.7 g/dL (ref 31.5–35.7)
MCV: 87 fL (ref 79–97)
MONOS ABS: 0.6 10*3/uL (ref 0.1–0.9)
Monocytes: 7 %
NEUTROS PCT: 54 %
Neutrophils Absolute: 4.5 10*3/uL (ref 1.4–7.0)
PLATELETS: 348 10*3/uL (ref 150–379)
RBC: 4.42 x10E6/uL (ref 3.77–5.28)
RDW: 13.6 % (ref 12.3–15.4)
WBC: 8.2 10*3/uL (ref 3.4–10.8)

## 2017-03-25 LAB — HEMOGLOBIN A1C
ESTIMATED AVERAGE GLUCOSE: 137 mg/dL
Hgb A1c MFr Bld: 6.4 % — ABNORMAL HIGH (ref 4.8–5.6)

## 2017-03-25 LAB — LIPID PANEL
CHOLESTEROL TOTAL: 193 mg/dL (ref 100–199)
Chol/HDL Ratio: 3.9 ratio (ref 0.0–4.4)
HDL: 50 mg/dL (ref >39)
LDL Calculated: 126 mg/dL — ABNORMAL HIGH (ref 0–99)
Triglycerides: 86 mg/dL (ref 0–149)
VLDL Cholesterol Cal: 17 mg/dL (ref 5–40)

## 2017-03-25 LAB — TSH: TSH: 2.64 u[IU]/mL (ref 0.450–4.500)

## 2017-03-27 ENCOUNTER — Telehealth: Payer: Self-pay

## 2017-03-27 NOTE — Telephone Encounter (Signed)
LMTCB  Thanks,  -Brienne Liguori 

## 2017-03-27 NOTE — Telephone Encounter (Signed)
-----   Message from Margaretann LovelessJennifer M Burnette, PA-C sent at 03/27/2017  3:46 PM EST ----- Cholesterol improved from last year. A1c at 6.4 down from 6.5. Kidney and liver function are normal. Blood count normal. Thyroid normal.

## 2017-03-27 NOTE — Telephone Encounter (Signed)
Patient advised.KW 

## 2017-08-01 ENCOUNTER — Ambulatory Visit: Payer: BC Managed Care – PPO | Admitting: Physician Assistant

## 2017-08-01 ENCOUNTER — Encounter: Payer: Self-pay | Admitting: Physician Assistant

## 2017-08-01 VITALS — BP 124/88 | HR 84 | Temp 98.5°F | Resp 16 | Wt 166.0 lb

## 2017-08-01 DIAGNOSIS — J029 Acute pharyngitis, unspecified: Secondary | ICD-10-CM

## 2017-08-01 MED ORDER — AZITHROMYCIN 250 MG PO TABS: ORAL_TABLET | ORAL | 0 refills | Status: DC

## 2017-08-01 NOTE — Progress Notes (Signed)
Patient: Tracey Kennedy Female    DOB: 11/03/1980   37 y.o.   MRN: [id number] Visit Date: 08/01/2017  Today's Provider: Trey Sailors, PA-C   Chief Complaint  Patient presents with  . Sore Throat    Started yesterday   Subjective:   Rapid strep negative. Patient concerned because she is leaving for trip. Kids are not sick.   Sore Throat   This is a new problem. The current episode started yesterday. The pain is worse on the right side. There has been no fever. Associated symptoms include a plugged ear sensation. Pertinent negatives include no abdominal pain, congestion, coughing, diarrhea, drooling, ear discharge, ear pain, headaches, hoarse voice, neck pain, shortness of breath, stridor, swollen glands, trouble swallowing or vomiting.       Allergies  Allergen Reactions  . Penicillins      Current Outpatient Medications:  .  CAMILA 0.35 MG tablet, Take 1 tablet by mouth daily., Disp: , Rfl: 3 .  glucose blood (ONE TOUCH ULTRA TEST) test strip, Check blood glucose readings ACHS, Disp: 100 each, Rfl: 12 .  labetalol (NORMODYNE) 200 MG tablet, Take 1 tablet (200 mg total) by mouth 2 (two) times daily., Disp: 180 tablet, Rfl: 3 .  Lancets (ONETOUCH ULTRASOFT) lancets, Check blood sugar ACHS, Disp: 100 each, Rfl: 12 .  metFORMIN (GLUCOPHAGE) 500 MG tablet, Take 1 tablet (500 mg total) by mouth 2 (two) times daily., Disp: 180 tablet, Rfl: 3 .  triamterene-hydrochlorothiazide (MAXZIDE-25) 37.5-25 MG tablet, TAKE 1 TABLET BY MOUTH DAILY., Disp: 90 tablet, Rfl: 3 .  azithromycin (ZITHROMAX) 250 MG tablet, Take 2 pills on day 1, take one pill on each of the following four days., Disp: 6 tablet, Rfl: 0 .  pantoprazole (PROTONIX) 40 MG tablet, TAKE 1 TABLET BY MOUTH EVERY DAY (Patient not taking: Reported on 08/01/2017), Disp: 90 tablet, Rfl: 1  Review of Systems  Constitutional: Positive for fatigue. Negative for activity change, appetite change, chills, diaphoresis, fever  and unexpected weight change.  HENT: Positive for sore throat. Negative for congestion, drooling, ear discharge, ear pain, hoarse voice, nosebleeds, postnasal drip, rhinorrhea, sinus pressure, sinus pain, sneezing, tinnitus, trouble swallowing and voice change.   Eyes: Negative.   Respiratory: Negative.  Negative for cough, shortness of breath and stridor.   Gastrointestinal: Negative.  Negative for abdominal pain, diarrhea and vomiting.  Musculoskeletal: Negative for neck pain and neck stiffness.  Neurological: Negative for dizziness, light-headedness and headaches.    Social History   Tobacco Use  . Smoking status: Never Smoker  . Smokeless tobacco: Never Used  Substance Use Topics  . Alcohol use: No   Objective:   BP 124/88 (BP Location: Right Arm, Patient Position: Sitting, Cuff Size: Normal)   Pulse 84   Temp 98.5 F (36.9 C) (Oral)   Resp 16   Wt 166 lb (75.3 kg)   LMP 07/25/2017   BMI 29.41 kg/m  Vitals:   08/01/17 1145  BP: 124/88  Pulse: 84  Resp: 16  Temp: 98.5 F (36.9 C)  TempSrc: Oral  Weight: 166 lb (75.3 kg)     Physical Exam  Constitutional: She is oriented to person, place, and time. She appears well-developed and well-nourished.  HENT:  Right Ear: Tympanic membrane and ear canal normal.  Left Ear: Tympanic membrane and ear canal normal.  Mouth/Throat: Uvula is midline, oropharynx is clear and moist and mucous membranes are normal. No oral lesions. No uvula swelling. No oropharyngeal  exudate, posterior oropharyngeal edema or posterior oropharyngeal erythema.  Eyes: Pupils are equal, round, and reactive to light. EOM are normal.  Cardiovascular: Normal rate.  Neurological: She is alert and oriented to person, place, and time.  Psychiatric: She has a normal mood and affect. Her behavior is normal.        Assessment & Plan:     1. Sore throat  Likely viral, gave hard script to fill if worsening as culture will likely take several days to come back  and she will either feel better or worse before then.   - POCT rapid strep A - azithromycin (ZITHROMAX) 250 MG tablet; Take 2 pills on day 1, take one pill on each of the following four days.  Dispense: 6 tablet; Refill: 0  Return if symptoms worsen or fail to improve.  The entirety of the information documented in the History of Present Illness, Review of Systems and Physical Exam were personally obtained by me. Portions of this information were initially documented by Kavin LeechLaura Walsh, CMA and reviewed by me for thoroughness and accuracy.        Trey SailorsAdriana M Pollak, PA-C  [facility]

## 2017-08-01 NOTE — Patient Instructions (Signed)

## 2017-08-03 LAB — RESULTS CONSOLE HPV: CHL HPV: NEGATIVE

## 2017-08-09 LAB — POCT RAPID STREP A (OFFICE): Rapid Strep A Screen: NEGATIVE

## 2017-08-18 LAB — HM DIABETES EYE EXAM

## 2017-08-24 ENCOUNTER — Encounter: Payer: Self-pay | Admitting: Physician Assistant

## 2017-10-21 ENCOUNTER — Other Ambulatory Visit: Payer: Self-pay | Admitting: Physician Assistant

## 2017-10-21 DIAGNOSIS — E282 Polycystic ovarian syndrome: Secondary | ICD-10-CM

## 2017-10-21 DIAGNOSIS — E119 Type 2 diabetes mellitus without complications: Secondary | ICD-10-CM

## 2017-11-27 ENCOUNTER — Other Ambulatory Visit: Payer: Self-pay | Admitting: Physician Assistant

## 2017-11-27 DIAGNOSIS — I10 Essential (primary) hypertension: Secondary | ICD-10-CM

## 2018-03-26 ENCOUNTER — Encounter: Payer: Self-pay | Admitting: Physician Assistant

## 2018-03-26 ENCOUNTER — Ambulatory Visit (INDEPENDENT_AMBULATORY_CARE_PROVIDER_SITE_OTHER): Payer: BC Managed Care – PPO | Admitting: Physician Assistant

## 2018-03-26 VITALS — BP 132/88 | HR 94 | Temp 98.6°F | Resp 16 | Ht 63.0 in | Wt 173.0 lb

## 2018-03-26 DIAGNOSIS — Z13220 Encounter for screening for lipoid disorders: Secondary | ICD-10-CM | POA: Diagnosis not present

## 2018-03-26 DIAGNOSIS — Z0000 Encounter for general adult medical examination without abnormal findings: Secondary | ICD-10-CM | POA: Diagnosis not present

## 2018-03-26 DIAGNOSIS — E119 Type 2 diabetes mellitus without complications: Secondary | ICD-10-CM | POA: Diagnosis not present

## 2018-03-26 DIAGNOSIS — I10 Essential (primary) hypertension: Secondary | ICD-10-CM

## 2018-03-26 DIAGNOSIS — Z136 Encounter for screening for cardiovascular disorders: Secondary | ICD-10-CM

## 2018-03-26 DIAGNOSIS — Z1329 Encounter for screening for other suspected endocrine disorder: Secondary | ICD-10-CM

## 2018-03-26 DIAGNOSIS — Z6830 Body mass index (BMI) 30.0-30.9, adult: Secondary | ICD-10-CM

## 2018-03-26 DIAGNOSIS — E6609 Other obesity due to excess calories: Secondary | ICD-10-CM | POA: Insufficient documentation

## 2018-03-26 LAB — POCT UA - MICROALBUMIN: MICROALBUMIN (UR) POC: NEGATIVE mg/L

## 2018-03-26 NOTE — Patient Instructions (Signed)

## 2018-03-26 NOTE — Progress Notes (Signed)
Patient: Tracey Kennedy, Female    DOB: 18-Jun-1980, 38 y.o.   MRN: [id number] Visit Date: 03/26/2018  Today's Provider: Mar Daring, PA-C   Chief Complaint  Patient presents with  . Annual Exam   Subjective:     Annual physical exam Tracey Kennedy is a 38 y.o. female who presents today for health maintenance and complete physical. She feels well. She reports exercising some. She reports she is sleeping fairly well.  Pap: Patient is followed at [facility] -----------------------------------------------------------------   Review of Systems  Constitutional: Negative.   HENT: Negative.   Eyes: Negative.   Respiratory: Negative.   Cardiovascular: Negative.   Gastrointestinal: Negative.   Endocrine: Negative.   Genitourinary: Negative.   Musculoskeletal: Negative.   Skin: Negative.   Allergic/Immunologic: Negative.   Neurological: Negative.   Hematological: Negative.   Psychiatric/Behavioral: Negative.     Social History      She  reports that she has never smoked. She has never used smokeless tobacco. She reports that she does not drink alcohol or use drugs.       Social History   Socioeconomic History  . Marital status: Married    Spouse name: Not on file  . Number of children: Not on file  . Years of education: Not on file  . Highest education level: Not on file  Occupational History  . Not on file  Social Needs  . Financial resource strain: Not on file  . Food insecurity:    Worry: Not on file    Inability: Not on file  . Transportation needs:    Medical: Not on file    Non-medical: Not on file  Tobacco Use  . Smoking status: Never Smoker  . Smokeless tobacco: Never Used  Substance and Sexual Activity  . Alcohol use: No  . Drug use: No  . Sexual activity: Yes  Lifestyle  . Physical activity:    Days per week: Not on file    Minutes per session: Not on file  . Stress: Not on file  Relationships  .  Social connections:    Talks on phone: Not on file    Gets together: Not on file    Attends religious service: Not on file    Active member of club or organization: Not on file    Attends meetings of clubs or organizations: Not on file    Relationship status: Not on file  Other Topics Concern  . Not on file  Social History Narrative  . Not on file    Past Medical History:  Diagnosis Date  . Diabetes mellitus without complication ([place])   . Hypertension      Patient Active Problem List   Diagnosis Date Noted  . Bilateral polycystic ovarian syndrome 09/11/2015  . Body mass index (BMI) of 29.0-29.9 in adult 09/11/2015  . Essential hypertension 10/20/2014  . Diabetes ([place]) 10/20/2014  . Calculus of gallbladder 04/15/2008    Past Surgical History:  Procedure Laterality Date  . CHOLECYSTECTOMY      Family History        Family Status  Relation Name Status  . Mother  Deceased       Apr 07, 2006 of multiple myeloma  . Father  Alive        Her family history is not on file.      Allergies  Allergen Reactions  . Penicillins      Current Outpatient Medications:  .  CAMILA 0.35 MG tablet, Take 1 tablet by mouth daily., Disp: , Rfl: 3 .  glucose blood (ONE TOUCH ULTRA TEST) test strip, Check blood glucose readings ACHS, Disp: 100 each, Rfl: 12 .  labetalol (NORMODYNE) 200 MG tablet, TAKE 1 TABLET BY MOUTH TWICE A DAY, Disp: 180 tablet, Rfl: 3 .  Lancets (ONETOUCH ULTRASOFT) lancets, Check blood sugar ACHS, Disp: 100 each, Rfl: 12 .  metFORMIN (GLUCOPHAGE) 500 MG tablet, TAKE 1 TABLET BY MOUTH TWICE A DAY, Disp: 180 tablet, Rfl: 3 .  triamterene-hydrochlorothiazide (MAXZIDE-25) 37.5-25 MG tablet, TAKE 1 TABLET BY MOUTH EVERY DAY, Disp: 90 tablet, Rfl: 3 .  pantoprazole (PROTONIX) 40 MG tablet, TAKE 1 TABLET BY MOUTH EVERY DAY (Patient not taking: Reported on 08/01/2017), Disp: 90 tablet, Rfl: 1   Patient Care Team: Mar Daring, PA-C as PCP - General (Family Medicine)      Objective:    Vitals: BP 132/88 (BP Location: Left Arm, Patient Position: Sitting, Cuff Size: Normal)   Pulse 94   Temp 98.6 F (37 C) (Oral)   Resp 16   Ht _0  (1.6 m)   Wt 173 lb (78.5 kg)   LMP 03/14/2018   BMI 30.65 kg/m    Vitals:   03/26/18 1413  BP: 132/88  Pulse: 94  Resp: 16  Temp: 98.6 F (37 C)  TempSrc: Oral  Weight: 173 lb (78.5 kg)  Height: _1  (1.6 m)     Physical Exam Vitals signs reviewed.  Constitutional:      General: She is not in acute distress.    Appearance: Normal appearance. She is well-developed. She is obese. She is not diaphoretic.  HENT:     Head: Normocephalic and atraumatic.     Right Ear: Tympanic membrane, ear canal and external ear normal.     Left Ear: Tympanic membrane, ear canal and external ear normal.     Nose: Nose normal.     Mouth/Throat:     Mouth: Mucous membranes are moist.     Pharynx: Oropharynx is clear. No oropharyngeal exudate.  Eyes:     General: No scleral icterus.       Right eye: No discharge.        Left eye: No discharge.     Extraocular Movements: Extraocular movements intact.     Conjunctiva/sclera: Conjunctivae normal.     Pupils: Pupils are equal, round, and reactive to light.  Neck:     Musculoskeletal: Normal range of motion and neck supple.     Thyroid: No thyromegaly.     Vascular: No JVD.     Trachea: No tracheal deviation.  Cardiovascular:     Rate and Rhythm: Normal rate and regular rhythm.     Pulses: Normal pulses.     Heart sounds: Normal heart sounds. No murmur. No friction rub. No gallop.   Pulmonary:     Effort: Pulmonary effort is normal. No respiratory distress.     Breath sounds: Normal breath sounds. No wheezing or rales.  Chest:     Chest wall: No tenderness.  Abdominal:     General: Bowel sounds are normal. There is no distension.     Palpations: Abdomen is soft. There is no mass.     Tenderness: There is no abdominal tenderness. There is no guarding or rebound.   Genitourinary:    Comments: Deferred to gyn Musculoskeletal: Normal range of motion.        General: No tenderness.  Lymphadenopathy:  Cervical: No cervical adenopathy.  Skin:    General: Skin is warm and dry.     Findings: No rash.  Neurological:     Mental Status: She is alert and oriented to person, place, and time.  Psychiatric:        Mood and Affect: Mood normal.        Behavior: Behavior normal.        Thought Content: Thought content normal.        Judgment: Judgment normal.      Depression Screen PHQ 2/9 Scores 03/26/2018 03/24/2017 09/12/2016  PHQ - 2 Score 0 0 0  PHQ- 9 Score - - 0       Assessment & Plan:     Routine Health Maintenance and Physical Exam  Exercise Activities and Dietary recommendations Goals   None      There is no immunization history on file for this patient.  Health Maintenance  Topic Date Due  . PNEUMOCOCCAL POLYSACCHARIDE VACCINE AGE 41-64 HIGH RISK  10/29/1980  . URINE MICROALBUMIN  10/29/1988  . HIV Screening  10/29/1993  . TETANUS/TDAP  10/29/1997  . PAP SMEAR-Modifier  10/30/1999  . HEMOGLOBIN A1C  09/21/2017  . FOOT EXAM  03/24/2018  . INFLUENZA VACCINE  05/08/2018 (Originally 09/07/2017)  . OPHTHALMOLOGY EXAM  08/19/2018     Discussed health benefits of physical activity, and encouraged her to engage in regular exercise appropriate for her age and condition.    1. Annual physical exam Normal physical exam today. Will check labs as below and f/u pending lab results. If labs are stable and WNL she will not need to have these rechecked for one year at her next annual physical exam. She is to call the office in the meantime if she has any acute issue, questions or concerns. - CBC with Differential/Platelet - Comprehensive metabolic panel - TSH  2. Type 2 diabetes mellitus without complication, unspecified whether long term insulin use (HCC) Stable on metformin 550m BID. Urine microalbumin normal today. Will check labs  as below and f/u pending results. - Hemoglobin A1c - POCT UA - Microalbumin  3. Essential hypertension Stable. Continue labetalol 2063mBID. Will check labs as below and f/u pending results.  4. Encounter for lipid screening for cardiovascular disease Diet controlled. Will check labs as below and f/u pending results. - Lipid panel  5. Screening for thyroid disorder Will check labs as below and f/u pending results. - TSH  6. Class 1 obesity due to excess calories with serious comorbidity and body mass index (BMI) of 30.0 to 30.9 in adult Counseled patient on healthy lifestyle modifications including dieting and exercise.   --------------------------------------------------------------------    JeMar DaringPA-C  [facility]

## 2018-03-27 LAB — COMPREHENSIVE METABOLIC PANEL
A/G RATIO: 2 (ref 1.2–2.2)
ALBUMIN: 4.6 g/dL (ref 3.8–4.8)
ALT: 13 IU/L (ref 0–32)
AST: 14 IU/L (ref 0–40)
Alkaline Phosphatase: 73 IU/L (ref 39–117)
BUN/Creatinine Ratio: 13 (ref 9–23)
BUN: 10 mg/dL (ref 6–20)
Bilirubin Total: 0.5 mg/dL (ref 0.0–1.2)
CALCIUM: 9.5 mg/dL (ref 8.7–10.2)
CO2: 20 mmol/L (ref 20–29)
Chloride: 100 mmol/L (ref 96–106)
Creatinine, Ser: 0.75 mg/dL (ref 0.57–1.00)
GFR, EST AFRICAN AMERICAN: 116 mL/min/{1.73_m2} (ref >59)
GFR, EST NON AFRICAN AMERICAN: 101 mL/min/{1.73_m2} (ref >59)
GLOBULIN, TOTAL: 2.3 g/dL (ref 1.5–4.5)
Glucose: 202 mg/dL — ABNORMAL HIGH (ref 65–99)
POTASSIUM: 3.9 mmol/L (ref 3.5–5.2)
Sodium: 137 mmol/L (ref 134–144)
TOTAL PROTEIN: 6.9 g/dL (ref 6.0–8.5)

## 2018-03-27 LAB — CBC WITH DIFFERENTIAL/PLATELET
BASOS: 1 %
Basophils Absolute: 0.1 10*3/uL (ref 0.0–0.2)
EOS (ABSOLUTE): 0.3 10*3/uL (ref 0.0–0.4)
Eos: 4 %
HEMATOCRIT: 37.8 % (ref 34.0–46.6)
HEMOGLOBIN: 13.3 g/dL (ref 11.1–15.9)
IMMATURE GRANS (ABS): 0 10*3/uL (ref 0.0–0.1)
IMMATURE GRANULOCYTES: 0 %
LYMPHS: 32 %
Lymphocytes Absolute: 2.5 10*3/uL (ref 0.7–3.1)
MCH: 30.1 pg (ref 26.6–33.0)
MCHC: 35.2 g/dL (ref 31.5–35.7)
MCV: 86 fL (ref 79–97)
Monocytes Absolute: 0.5 10*3/uL (ref 0.1–0.9)
Monocytes: 7 %
NEUTROS PCT: 56 %
Neutrophils Absolute: 4.6 10*3/uL (ref 1.4–7.0)
PLATELETS: 374 10*3/uL (ref 150–450)
RBC: 4.42 x10E6/uL (ref 3.77–5.28)
RDW: 12.5 % (ref 11.7–15.4)
WBC: 8 10*3/uL (ref 3.4–10.8)

## 2018-03-27 LAB — HEMOGLOBIN A1C
Est. average glucose Bld gHb Est-mCnc: 140 mg/dL
Hgb A1c MFr Bld: 6.5 % — ABNORMAL HIGH (ref 4.8–5.6)

## 2018-03-27 LAB — LIPID PANEL
CHOLESTEROL TOTAL: 198 mg/dL (ref 100–199)
Chol/HDL Ratio: 4 ratio (ref 0.0–4.4)
HDL: 49 mg/dL (ref >39)
LDL CALC: 128 mg/dL — AB (ref 0–99)
Triglycerides: 105 mg/dL (ref 0–149)
VLDL CHOLESTEROL CAL: 21 mg/dL (ref 5–40)

## 2018-03-27 LAB — TSH: TSH: 2.34 u[IU]/mL (ref 0.450–4.500)

## 2018-03-28 ENCOUNTER — Telehealth: Payer: Self-pay

## 2018-03-28 NOTE — Telephone Encounter (Signed)
Viewed by Armen Pickup on 03/27/2018 5:46 PM

## 2018-03-28 NOTE — Telephone Encounter (Signed)
-----   Message from Margaretann Loveless, [place] sent at 03/27/2018  2:09 PM EST ----- Blood count is normal. Kidney and liver function is normal. A1c slightly up to 6.5, from 6.4 last year. Cholesterol normal and essentially stable. Work on healthy lifestyle modifications with diet and exercise. Thyroid is normal.

## 2018-08-24 ENCOUNTER — Encounter: Payer: Self-pay | Admitting: Physician Assistant

## 2018-09-01 ENCOUNTER — Encounter: Payer: Self-pay | Admitting: Physician Assistant

## 2018-09-01 DIAGNOSIS — E119 Type 2 diabetes mellitus without complications: Secondary | ICD-10-CM

## 2018-09-03 MED ORDER — GLUCOSE BLOOD VI STRP: ORAL_STRIP | 12 refills | Status: DC

## 2018-09-03 MED ORDER — ONETOUCH ULTRASOFT LANCETS MISC: 12 refills | Status: DC

## 2018-10-09 ENCOUNTER — Other Ambulatory Visit: Payer: Self-pay | Admitting: Physician Assistant

## 2018-10-09 DIAGNOSIS — E119 Type 2 diabetes mellitus without complications: Secondary | ICD-10-CM

## 2018-10-09 DIAGNOSIS — E282 Polycystic ovarian syndrome: Secondary | ICD-10-CM

## 2018-11-05 ENCOUNTER — Encounter: Payer: Self-pay | Admitting: Physician Assistant

## 2018-11-07 ENCOUNTER — Other Ambulatory Visit: Payer: Self-pay | Admitting: Obstetrics and Gynecology

## 2018-11-07 DIAGNOSIS — N6489 Other specified disorders of breast: Secondary | ICD-10-CM

## 2018-11-07 NOTE — Progress Notes (Signed)
Patient: Tracey Kennedy Female    DOB: 1980-06-04   38 y.o.   MRN: [id number] Visit Date: 11/13/2018  Today's Provider: Margaretann Loveless, PA-C   Chief Complaint  Patient presents with  . Follow-up    DM   Subjective:    I,Tracey Kennedy,RMA am acting as a Neurosurgeon for [employer], PA-C.  HPI    Diabetes Mellitus Type II, Follow-up:   Lab Results  Component Value Date   HGBA1C 6.3 (A) 11/08/2018   HGBA1C 6.5 (H) 03/26/2018   HGBA1C 6.4 (H) 03/24/2017   Last seen for diabetes 8 months ago.  Management since then includes; labs checked. Advised to work on healthy lifestyle modifications with diet and exercise. . She reports excellent compliance with treatment. She is not having side effects.  Current symptoms include polydipsia and have been stable. Home blood sugar records: fasting range: 120-130 Patient reports that at the OB-GYN office visit last week she had traces of sugar in her urine and four days ago her fasting glucose was 175. This morning it was 145.   Episodes of hypoglycemia? no   Current Insulin Regimen: none Most Recent Eye Exam: scheduled at the end of this month. Current diet: well balanced Current exercise: walking  ------------------------------------------------------------------------   Hypertension, follow-up:  BP Readings from Last 3 Encounters:  11/08/18 (!) 131/91  03/26/18 132/88  08/01/17 124/88    She was last seen for hypertension 8 months ago.  BP at that visit was 132/88. Management since that visit includes; no changes.She reports excellent compliance with treatment. She is not having side effects.  She is exercising. She is not adherent to low salt diet.   Outside blood pressures are 120/80. She is experiencing none.  Patient denies chest pain, chest pressure/discomfort, exertional chest pressure/discomfort, fatigue, irregular heart beat, lower extremity edema, near-syncope and palpitations.    Cardiovascular risk factors include diabetes mellitus, hypertension and obesity (BMI >= 30 kg/m2).   ------------------------------------------------------------------------    Lipid/Cholesterol, Follow-up:   Last seen for this 8 months ago.  Management since that visit includes; labs checked, no changes.  Last Lipid Panel:    Component Value Date/Time   CHOL 198 03/26/2018 1459   TRIG 105 03/26/2018 1459   HDL 49 03/26/2018 1459   CHOLHDL 4.0 03/26/2018 1459   LDLCALC 128 (H) 03/26/2018 1459    She reports excellent compliance with treatment. She is not having side effects.   Wt Readings from Last 3 Encounters:  11/08/18 163 lb 3.2 oz (74 kg)  03/26/18 173 lb (78.5 kg)  08/01/17 166 lb (75.3 kg)    ------------------------------------------------------------------------     Allergies  Allergen Reactions  . Penicillins      Current Outpatient Medications:  .  CAMILA 0.35 MG tablet, Take 1 tablet by mouth daily., Disp: , Rfl: 3 .  glucose blood (ONE TOUCH ULTRA TEST) test strip, Check blood glucose readings ACHS, Disp: 300 each, Rfl: 12 .  labetalol (NORMODYNE) 200 MG tablet, TAKE 1 TABLET BY MOUTH TWICE A DAY, Disp: 180 tablet, Rfl: 3 .  Lancets (ONETOUCH ULTRASOFT) lancets, Check blood sugar ACHS, Disp: 300 each, Rfl: 12 .  metFORMIN (GLUCOPHAGE) 500 MG tablet, Take 1000mg  (2 tabs) PO q am and 500mg  (1 tab) q pm, Disp: 270 tablet, Rfl: 1 .  triamterene-hydrochlorothiazide (MAXZIDE-25) 37.5-25 MG tablet, TAKE 1 TABLET BY MOUTH EVERY DAY, Disp: 90 tablet, Rfl: 3  Review of Systems  Constitutional: Negative for appetite change,  chills, fatigue and fever.  Respiratory: Negative for chest tightness and shortness of breath.   Cardiovascular: Negative for chest pain and palpitations.  Gastrointestinal: Negative for abdominal pain, nausea and vomiting.  Neurological: Negative for dizziness and weakness.    Social History   Tobacco Use  . Smoking status: Never  Smoker  . Smokeless tobacco: Never Used  Substance Use Topics  . Alcohol use: No      Objective:   BP (!) 131/91 (BP Location: Left Arm, Patient Position: Sitting, Cuff Size: Large) Comment: @ home this AM 125/84  Pulse 84   Temp (!) 97.5 F (36.4 C) (Other (Comment)) Comment (Src): forehea  Resp 16   Wt 163 lb 3.2 oz (74 kg)   BMI 28.91 kg/m  Vitals:   11/08/18 0900  BP: (!) 131/91  Pulse: 84  Resp: 16  Temp: (!) 97.5 F (36.4 C)  TempSrc: Other (Comment)  Weight: 163 lb 3.2 oz (74 kg)  Body mass index is 28.91 kg/m.   Physical Exam Vitals signs reviewed.  Constitutional:      General: She is not in acute distress.    Appearance: Normal appearance. She is well-developed. She is not ill-appearing or diaphoretic.  HENT:     Head: Normocephalic and atraumatic.  Eyes:     General: No scleral icterus.    Extraocular Movements: Extraocular movements intact.  Neck:     Musculoskeletal: Normal range of motion and neck supple.     Thyroid: No thyromegaly.     Vascular: No JVD.     Trachea: No tracheal deviation.  Cardiovascular:     Rate and Rhythm: Normal rate and regular rhythm.     Heart sounds: Normal heart sounds. No murmur. No friction rub. No gallop.   Pulmonary:     Effort: Pulmonary effort is normal. No respiratory distress.     Breath sounds: Normal breath sounds. No wheezing or rales.  Lymphadenopathy:     Cervical: No cervical adenopathy.  Neurological:     Mental Status: She is alert.      Results for orders placed or performed in visit on 11/08/18  CBC w/Diff/Platelet  Result Value Ref Range   WBC 8.8 3.4 - 10.8 x10E3/uL   RBC 4.70 3.77 - 5.28 x10E6/uL   Hemoglobin 14.2 11.1 - 15.9 g/dL   Hematocrit 16.141.2 09.634.0 - 46.6 %   MCV 88 79 - 97 fL   MCH 30.2 26.6 - 33.0 pg   MCHC 34.5 31.5 - 35.7 g/dL   RDW 04.512.2 40.911.7 - 81.115.4 %   Platelets 368 150 - 450 x10E3/uL   Neutrophils 62 Not Estab. %   Lymphs 27 Not Estab. %   Monocytes 7 Not Estab. %   Eos 3  Not Estab. %   Basos 1 Not Estab. %   Neutrophils Absolute 5.5 1.4 - 7.0 x10E3/uL   Lymphocytes Absolute 2.4 0.7 - 3.1 x10E3/uL   Monocytes Absolute 0.6 0.1 - 0.9 x10E3/uL   EOS (ABSOLUTE) 0.2 0.0 - 0.4 x10E3/uL   Basophils Absolute 0.1 0.0 - 0.2 x10E3/uL   Immature Granulocytes 0 Not Estab. %   Immature Grans (Abs) 0.0 0.0 - 0.1 x10E3/uL  Comprehensive Metabolic Panel (CMET)  Result Value Ref Range   Glucose 113 (H) 65 - 99 mg/dL   BUN 12 6 - 24 mg/dL   Creatinine, Ser 9.140.80 0.57 - 1.00 mg/dL   GFR calc non Af Amer 93 >59 mL/min/1.73   GFR calc Af Amer 107 >59 mL/min/1.73  BUN/Creatinine Ratio 15 9 - 23   Sodium 137 134 - 144 mmol/L   Potassium 3.9 3.5 - 5.2 mmol/L   Chloride 99 96 - 106 mmol/L   CO2 21 20 - 29 mmol/L   Calcium 9.6 8.7 - 10.2 mg/dL   Total Protein 7.2 6.0 - 8.5 g/dL   Albumin 4.9 (H) 3.8 - 4.8 g/dL   Globulin, Total 2.3 1.5 - 4.5 g/dL   Albumin/Globulin Ratio 2.1 1.2 - 2.2   Bilirubin Total 1.1 0.0 - 1.2 mg/dL   Alkaline Phosphatase 66 39 - 117 IU/L   AST 13 0 - 40 IU/L   ALT 11 0 - 32 IU/L  POCT glycosylated hemoglobin (Hb A1C)  Result Value Ref Range   Hemoglobin A1C 6.3 (A) 4.0 - 5.6 %   Est. average glucose Bld gHb Est-mCnc 134   POCT UA - Microalbumin  Result Value Ref Range   Microalbumin Ur, POC 20 mg/L  POCT Urinalysis Dipstick  Result Value Ref Range   Color, UA light yellow    Clarity, UA clear    Glucose, UA Negative Negative   Bilirubin, UA Negative    Ketones, UA Negative    Spec Grav, UA 1.010 1.010 - 1.025   Blood, UA Negative    pH, UA 6.5 5.0 - 8.0   Protein, UA Negative Negative   Urobilinogen, UA 0.2 0.2 or 1.0 E.U./dL   Nitrite, UA Negative    Leukocytes, UA Negative Negative   Appearance     Odor         Assessment & Plan    1. Type 2 diabetes mellitus without complication, unspecified whether long term insulin use (HCC) A1c stable at 6.5. Microalbumin was 20. Metformin increased to 1000mg  q am and 500mg  q pm as below.  Will check labs as below and f/u pending results. Return in 3 months.  - metFORMIN (GLUCOPHAGE) 500 MG tablet; Take 1000mg  (2 tabs) PO q am and 500mg  (1 tab) q pm  Dispense: 270 tablet; Refill: 1 - CBC w/Diff/Platelet - Comprehensive Metabolic Panel (CMET)  2. Essential hypertension Slightly elevated. Continue Maxzide 37.5-25mg . Recheck in 3 months.  3. Bilateral polycystic ovarian syndrome Continue OCP to prevent pregnancy. Metformin for insulin resistance associated.  - metFORMIN (GLUCOPHAGE) 500 MG tablet; Take 1000mg  (2 tabs) PO q am and 500mg  (1 tab) q pm  Dispense: 270 tablet; Refill: 1  4. Glucosuria UA normal today. Had glucose noted on UA from GYN per patient. Will check labs as below and f/u pending results. - POCT Urinalysis Dipstick - CBC w/Diff/Platelet - Comprehensive Metabolic Panel (CMET)  5. Need for influenza vaccination Flu vaccine given today without complication. Patient sat upright for 15 minutes to check for adverse reaction before being released. - Flu Vaccine QUAD 36+ mos IM     Mar Daring, PA-C  [facility]

## 2018-11-08 ENCOUNTER — Ambulatory Visit: Payer: BC Managed Care – PPO | Admitting: Physician Assistant

## 2018-11-08 ENCOUNTER — Encounter: Payer: Self-pay | Admitting: Physician Assistant

## 2018-11-08 ENCOUNTER — Other Ambulatory Visit: Payer: Self-pay

## 2018-11-08 VITALS — BP 131/91 | HR 84 | Temp 97.5°F | Resp 16 | Wt 163.2 lb

## 2018-11-08 DIAGNOSIS — R81 Glycosuria: Secondary | ICD-10-CM | POA: Diagnosis not present

## 2018-11-08 DIAGNOSIS — Z23 Encounter for immunization: Secondary | ICD-10-CM

## 2018-11-08 DIAGNOSIS — E282 Polycystic ovarian syndrome: Secondary | ICD-10-CM | POA: Diagnosis not present

## 2018-11-08 DIAGNOSIS — I10 Essential (primary) hypertension: Secondary | ICD-10-CM

## 2018-11-08 DIAGNOSIS — E119 Type 2 diabetes mellitus without complications: Secondary | ICD-10-CM | POA: Diagnosis not present

## 2018-11-08 LAB — POCT GLYCOSYLATED HEMOGLOBIN (HGB A1C)
Est. average glucose Bld gHb Est-mCnc: 134
Hemoglobin A1C: 6.3 % — AB (ref 4.0–5.6)

## 2018-11-08 LAB — POCT URINALYSIS DIPSTICK
Bilirubin, UA: NEGATIVE
Blood, UA: NEGATIVE
Glucose, UA: NEGATIVE
Ketones, UA: NEGATIVE
Leukocytes, UA: NEGATIVE
Nitrite, UA: NEGATIVE
Protein, UA: NEGATIVE
Spec Grav, UA: 1.01 (ref 1.010–1.025)
Urobilinogen, UA: 0.2 E.U./dL
pH, UA: 6.5 (ref 5.0–8.0)

## 2018-11-08 LAB — POCT UA - MICROALBUMIN: Microalbumin Ur, POC: 20 mg/L

## 2018-11-08 MED ORDER — METFORMIN HCL 500 MG PO TABS: ORAL_TABLET | ORAL | 1 refills | Status: DC

## 2018-11-08 NOTE — Patient Instructions (Signed)
Diabetes Mellitus and Exercise Exercising regularly is important for your overall health, especially when you have diabetes (diabetes mellitus). Exercising is not only about losing weight. It has many other health benefits, such as increasing muscle strength and bone density and reducing body fat and stress. This leads to improved fitness, flexibility, and endurance, all of which result in better overall health. Exercise has additional benefits for people with diabetes, including:  Reducing appetite.  Helping to lower and control blood glucose.  Lowering blood pressure.  Helping to control amounts of fatty substances (lipids) in the blood, such as cholesterol and triglycerides.  Helping the body to respond better to insulin (improving insulin sensitivity).  Reducing how much insulin the body needs.  Decreasing the risk for heart disease by: ? Lowering cholesterol and triglyceride levels. ? Increasing the levels of good cholesterol. ? Lowering blood glucose levels. What is my activity plan? Your health care provider or certified diabetes educator can help you make a plan for the type and frequency of exercise (activity plan) that works for you. Make sure that you:  Do at least 150 minutes of moderate-intensity or vigorous-intensity exercise each week. This could be brisk walking, biking, or water aerobics. ? Do stretching and strength exercises, such as yoga or weightlifting, at least 2 times a week. ? Spread out your activity over at least 3 days of the week.  Get some form of physical activity every day. ? Do not go more than 2 days in a row without some kind of physical activity. ? Avoid being inactive for more than 30 minutes at a time. Take frequent breaks to walk or stretch.  Choose a type of exercise or activity that you enjoy, and set realistic goals.  Start slowly, and gradually increase the intensity of your exercise over time. What do I need to know about managing my  diabetes?   Check your blood glucose before and after exercising. ? If your blood glucose is 240 mg/dL (13.3 mmol/L) or higher before you exercise, check your urine for ketones. If you have ketones in your urine, do not exercise until your blood glucose returns to normal. ? If your blood glucose is 100 mg/dL (5.6 mmol/L) or lower, eat a snack containing 15-20 grams of carbohydrate. Check your blood glucose 15 minutes after the snack to make sure that your level is above 100 mg/dL (5.6 mmol/L) before you start your exercise.  Know the symptoms of low blood glucose (hypoglycemia) and how to treat it. Your risk for hypoglycemia increases during and after exercise. Common symptoms of hypoglycemia can include: ? Hunger. ? Anxiety. ? Sweating and feeling clammy. ? Confusion. ? Dizziness or feeling light-headed. ? Increased heart rate or palpitations. ? Blurry vision. ? Tingling or numbness around the mouth, lips, or tongue. ? Tremors or shakes. ? Irritability.  Keep a rapid-acting carbohydrate snack available before, during, and after exercise to help prevent or treat hypoglycemia.  Avoid injecting insulin into areas of the body that are going to be exercised. For example, avoid injecting insulin into: ? The arms, when playing tennis. ? The legs, when jogging.  Keep records of your exercise habits. Doing this can help you and your health care provider adjust your diabetes management plan as needed. Write down: ? Food that you eat before and after you exercise. ? Blood glucose levels before and after you exercise. ? The type and amount of exercise you have done. ? When your insulin is expected to peak, if you use   insulin. Avoid exercising at times when your insulin is peaking.  When you start a new exercise or activity, work with your health care provider to make sure the activity is safe for you, and to adjust your insulin, medicines, or food intake as needed.  Drink plenty of water while  you exercise to prevent dehydration or heat stroke. Drink enough fluid to keep your urine clear or pale yellow. Summary  Exercising regularly is important for your overall health, especially when you have diabetes (diabetes mellitus).  Exercising has many health benefits, such as increasing muscle strength and bone density and reducing body fat and stress.  Your health care provider or certified diabetes educator can help you make a plan for the type and frequency of exercise (activity plan) that works for you.  When you start a new exercise or activity, work with your health care provider to make sure the activity is safe for you, and to adjust your insulin, medicines, or food intake as needed. This information is not intended to replace advice given to you by your health care provider. Make sure you discuss any questions you have with your health care provider. Document Released: 04/16/2003 Document Revised: 08/18/2016 Document Reviewed: 07/06/2015 Elsevier Patient Education  2020 Elsevier Inc.  

## 2018-11-09 ENCOUNTER — Telehealth: Payer: Self-pay

## 2018-11-09 LAB — COMPREHENSIVE METABOLIC PANEL
ALT: 11 IU/L (ref 0–32)
AST: 13 IU/L (ref 0–40)
Albumin/Globulin Ratio: 2.1 (ref 1.2–2.2)
Albumin: 4.9 g/dL — ABNORMAL HIGH (ref 3.8–4.8)
Alkaline Phosphatase: 66 IU/L (ref 39–117)
BUN/Creatinine Ratio: 15 (ref 9–23)
BUN: 12 mg/dL (ref 6–24)
Bilirubin Total: 1.1 mg/dL (ref 0.0–1.2)
CO2: 21 mmol/L (ref 20–29)
Calcium: 9.6 mg/dL (ref 8.7–10.2)
Chloride: 99 mmol/L (ref 96–106)
Creatinine, Ser: 0.8 mg/dL (ref 0.57–1.00)
GFR calc Af Amer: 107 mL/min/{1.73_m2} (ref >59)
GFR calc non Af Amer: 93 mL/min/{1.73_m2} (ref >59)
Globulin, Total: 2.3 g/dL (ref 1.5–4.5)
Glucose: 113 mg/dL — ABNORMAL HIGH (ref 65–99)
Potassium: 3.9 mmol/L (ref 3.5–5.2)
Sodium: 137 mmol/L (ref 134–144)
Total Protein: 7.2 g/dL (ref 6.0–8.5)

## 2018-11-09 LAB — CBC WITH DIFFERENTIAL/PLATELET
Basophils Absolute: 0.1 10*3/uL (ref 0.0–0.2)
Basos: 1 %
EOS (ABSOLUTE): 0.2 10*3/uL (ref 0.0–0.4)
Eos: 3 %
Hematocrit: 41.2 % (ref 34.0–46.6)
Hemoglobin: 14.2 g/dL (ref 11.1–15.9)
Immature Grans (Abs): 0 10*3/uL (ref 0.0–0.1)
Immature Granulocytes: 0 %
Lymphocytes Absolute: 2.4 10*3/uL (ref 0.7–3.1)
Lymphs: 27 %
MCH: 30.2 pg (ref 26.6–33.0)
MCHC: 34.5 g/dL (ref 31.5–35.7)
MCV: 88 fL (ref 79–97)
Monocytes Absolute: 0.6 10*3/uL (ref 0.1–0.9)
Monocytes: 7 %
Neutrophils Absolute: 5.5 10*3/uL (ref 1.4–7.0)
Neutrophils: 62 %
Platelets: 368 10*3/uL (ref 150–450)
RBC: 4.7 x10E6/uL (ref 3.77–5.28)
RDW: 12.2 % (ref 11.7–15.4)
WBC: 8.8 10*3/uL (ref 3.4–10.8)

## 2018-11-09 NOTE — Telephone Encounter (Signed)
-----   Message from Mar Daring, [place] sent at 11/09/2018  8:04 AM EDT ----- Blood count is normal. Kidney and liver function are normal. Sodium, potassium and calcium are normal.

## 2018-11-09 NOTE — Telephone Encounter (Signed)
Viewed by Suzanne Boron on 11/09/2018 8:09 AM Written by Mar Daring, PA-C on 11/09/2018 8:04 AM Blood count is normal. Kidney and liver function are normal. Sodium, potassium and calcium are normal.

## 2018-11-14 ENCOUNTER — Ambulatory Visit
Admission: RE | Admit: 2018-11-14 | Discharge: 2018-11-14 | Disposition: A | Discharge status: Home / Self Care | Payer: BC Managed Care – PPO | Source: Ambulatory Visit | Attending: Obstetrics and Gynecology | Admitting: Obstetrics and Gynecology

## 2018-11-14 ENCOUNTER — Other Ambulatory Visit: Payer: Self-pay

## 2018-11-14 ENCOUNTER — Other Ambulatory Visit: Payer: Self-pay | Admitting: Obstetrics and Gynecology

## 2018-11-14 DIAGNOSIS — N6489 Other specified disorders of breast: Secondary | ICD-10-CM

## 2018-11-25 ENCOUNTER — Other Ambulatory Visit: Payer: Self-pay | Admitting: Physician Assistant

## 2018-11-25 DIAGNOSIS — I10 Essential (primary) hypertension: Secondary | ICD-10-CM

## 2018-12-03 LAB — HM DIABETES EYE EXAM

## 2018-12-05 ENCOUNTER — Encounter: Payer: Self-pay | Admitting: Physician Assistant

## 2019-03-29 ENCOUNTER — Ambulatory Visit (INDEPENDENT_AMBULATORY_CARE_PROVIDER_SITE_OTHER): Payer: BC Managed Care – PPO | Admitting: Physician Assistant

## 2019-03-29 ENCOUNTER — Other Ambulatory Visit: Payer: Self-pay

## 2019-03-29 ENCOUNTER — Encounter: Payer: Self-pay | Admitting: Physician Assistant

## 2019-03-29 VITALS — BP 129/90 | HR 87 | Temp 97.2°F | Resp 16 | Ht 63.0 in | Wt 160.2 lb

## 2019-03-29 DIAGNOSIS — Z6828 Body mass index (BMI) 28.0-28.9, adult: Secondary | ICD-10-CM

## 2019-03-29 DIAGNOSIS — E119 Type 2 diabetes mellitus without complications: Secondary | ICD-10-CM | POA: Diagnosis not present

## 2019-03-29 DIAGNOSIS — I10 Essential (primary) hypertension: Secondary | ICD-10-CM | POA: Diagnosis not present

## 2019-03-29 DIAGNOSIS — Z0000 Encounter for general adult medical examination without abnormal findings: Secondary | ICD-10-CM | POA: Diagnosis not present

## 2019-03-29 DIAGNOSIS — E282 Polycystic ovarian syndrome: Secondary | ICD-10-CM

## 2019-03-29 NOTE — Patient Instructions (Signed)
Health Maintenance, Female Adopting a healthy lifestyle and getting preventive care are important in promoting health and wellness. Ask your health care provider about:  The right schedule for you to have regular tests and exams.  Things you can do on your own to prevent diseases and keep yourself healthy. What should I know about diet, weight, and exercise? Eat a healthy diet   Eat a diet that includes plenty of vegetables, fruits, low-fat dairy products, and lean protein.  Do not eat a lot of foods that are high in solid fats, added sugars, or sodium. Maintain a healthy weight Body mass index (BMI) is used to identify weight problems. It estimates body fat based on height and weight. Your health care provider can help determine your BMI and help you achieve or maintain a healthy weight. Get regular exercise Get regular exercise. This is one of the most important things you can do for your health. Most adults should:  Exercise for at least 150 minutes each week. The exercise should increase your heart rate and make you sweat (moderate-intensity exercise).  Do strengthening exercises at least twice a week. This is in addition to the moderate-intensity exercise.  Spend less time sitting. Even light physical activity can be beneficial. Watch cholesterol and blood lipids Have your blood tested for lipids and cholesterol at 41 years of age, then have this test every 5 years. Have your cholesterol levels checked more often if:  Your lipid or cholesterol levels are high.  You are older than 40 years of age.  You are at high risk for heart disease. What should I know about cancer screening? Depending on your health history and family history, you may need to have cancer screening at various ages. This may include screening for:  Breast cancer.  Cervical cancer.  Colorectal cancer.  Skin cancer.  Lung cancer. What should I know about heart disease, diabetes, and high blood  pressure? Blood pressure and heart disease  High blood pressure causes heart disease and increases the risk of stroke. This is more likely to develop in people who have high blood pressure readings, are of African descent, or are overweight.  Have your blood pressure checked: ? Every 3-5 years if you are 18-39 years of age. ? Every year if you are 40 years old or older. Diabetes Have regular diabetes screenings. This checks your fasting blood sugar level. Have the screening done:  Once every three years after age 40 if you are at a normal weight and have a low risk for diabetes.  More often and at a younger age if you are overweight or have a high risk for diabetes. What should I know about preventing infection? Hepatitis B If you have a higher risk for hepatitis B, you should be screened for this virus. Talk with your health care provider to find out if you are at risk for hepatitis B infection. Hepatitis C Testing is recommended for:  Everyone born from 1945 through 1965.  Anyone with known risk factors for hepatitis C. Sexually transmitted infections (STIs)  Get screened for STIs, including gonorrhea and chlamydia, if: ? You are sexually active and are younger than 41 years of age. ? You are older than 41 years of age and your health care provider tells you that you are at risk for this type of infection. ? Your sexual activity has changed since you were last screened, and you are at increased risk for chlamydia or gonorrhea. Ask your health care provider if   you are at risk.  Ask your health care provider about whether you are at high risk for HIV. Your health care provider may recommend a prescription medicine to help prevent HIV infection. If you choose to take medicine to prevent HIV, you should first get tested for HIV. You should then be tested every 3 months for as long as you are taking the medicine. Pregnancy  If you are about to stop having your period (premenopausal) and  you may become pregnant, seek counseling before you get pregnant.  Take 400 to 800 micrograms (mcg) of folic acid every day if you become pregnant.  Ask for birth control (contraception) if you want to prevent pregnancy. Osteoporosis and menopause Osteoporosis is a disease in which the bones lose minerals and strength with aging. This can result in bone fractures. If you are 65 years old or older, or if you are at risk for osteoporosis and fractures, ask your health care provider if you should:  Be screened for bone loss.  Take a calcium or vitamin D supplement to lower your risk of fractures.  Be given hormone replacement therapy (HRT) to treat symptoms of menopause. Follow these instructions at home: Lifestyle  Do not use any products that contain nicotine or tobacco, such as cigarettes, e-cigarettes, and chewing tobacco. If you need help quitting, ask your health care provider.  Do not use street drugs.  Do not share needles.  Ask your health care provider for help if you need support or information about quitting drugs. Alcohol use  Do not drink alcohol if: ? Your health care provider tells you not to drink. ? You are pregnant, may be pregnant, or are planning to become pregnant.  If you drink alcohol: ? Limit how much you use to 0-1 drink a day. ? Limit intake if you are breastfeeding.  Be aware of how much alcohol is in your drink. In the [place] one drink equals one 12 oz bottle of beer (355 mL), one 5 oz glass of wine (148 mL), or one 1 oz glass of hard liquor (44 mL). General instructions  Schedule regular health, dental, and eye exams.  Stay current with your vaccines.  Tell your health care provider if: ? You often feel depressed. ? You have ever been abused or do not feel safe at home. Summary  Adopting a healthy lifestyle and getting preventive care are important in promoting health and wellness.  Follow your health care provider's instructions about healthy  diet, exercising, and getting tested or screened for diseases.  Follow your health care provider's instructions on monitoring your cholesterol and blood pressure. This information is not intended to replace advice given to you by your health care provider. Make sure you discuss any questions you have with your health care provider. Document Revised: 01/17/2018 Document Reviewed: 01/17/2018 Elsevier Patient Education  2020 Elsevier Inc.  

## 2019-03-29 NOTE — Progress Notes (Signed)
Patient: Tracey Kennedy, Female    DOB: April 26, 1980, 39 y.o.   MRN: [id number] Visit Date: 03/29/2019  Today's Provider: Mar Daring, PA-C   Chief Complaint  Patient presents with  . Annual Exam   Subjective:     Annual physical exam Tracey Kennedy is a 39 y.o. female who presents today for health maintenance and complete physical. She feels well. She reports exercising none. She reports she is sleeping fairly well.  ----------------------------------------------------------------- Patient is followed by OB GYN Dr. Marvel Plan. She had a mammogram done and is due for a follow up. She is scheduled for a follow up in April 08/2019 [facility].   Review of Systems  Constitutional: Negative.   HENT: Negative.   Eyes: Negative.   Respiratory: Negative.   Cardiovascular: Negative.   Gastrointestinal: Negative.   Endocrine: Negative.   Genitourinary: Negative.   Skin: Negative.   Allergic/Immunologic: Negative.   Neurological: Negative.   Hematological: Negative.   Psychiatric/Behavioral: Negative.     Social History      She  reports that she has never smoked. She has never used smokeless tobacco. She reports that she does not drink alcohol or use drugs.       Social History   Socioeconomic History  . Marital status: Married    Spouse name: Not on file  . Number of children: Not on file  . Years of education: Not on file  . Highest education level: Not on file  Occupational History  . Not on file  Tobacco Use  . Smoking status: Never Smoker  . Smokeless tobacco: Never Used  Substance and Sexual Activity  . Alcohol use: No  . Drug use: No  . Sexual activity: Yes  Other Topics Concern  . Not on file  Social History Narrative  . Not on file   Social Determinants of Health   Financial Resource Strain:   . Difficulty of Paying Living Expenses: Not on file  Food Insecurity:   . Worried About Charity fundraiser in  the Last Year: Not on file  . Ran Out of Food in the Last Year: Not on file  Transportation Needs:   . Lack of Transportation (Medical): Not on file  . Lack of Transportation (Non-Medical): Not on file  Physical Activity:   . Days of Exercise per Week: Not on file  . Minutes of Exercise per Session: Not on file  Stress:   . Feeling of Stress : Not on file  Social Connections:   . Frequency of Communication with Friends and Family: Not on file  . Frequency of Social Gatherings with Friends and Family: Not on file  . Attends Religious Services: Not on file  . Active Member of Clubs or Organizations: Not on file  . Attends Archivist Meetings: Not on file  . Marital Status: Not on file    Past Medical History:  Diagnosis Date  . Diabetes mellitus without complication ([place])   . Hypertension      Patient Active Problem List   Diagnosis Date Noted  . Bilateral polycystic ovarian syndrome 09/11/2015  . BMI 28.0-28.9,adult 09/11/2015  . Essential hypertension 10/20/2014  . Diabetes ([place]) 10/20/2014  . Calculus of gallbladder 04/15/2008    Past Surgical History:  Procedure Laterality Date  . CHOLECYSTECTOMY      Family History        Family Status  Relation Name Status  . Mother  Deceased  2008 of multiple myeloma  . Father  Alive        Her family history is not on file.      Allergies  Allergen Reactions  . Penicillins      Current Outpatient Medications:  .  CAMILA 0.35 MG tablet, Take 1 tablet by mouth daily., Disp: , Rfl: 3 .  glucose blood (ONE TOUCH ULTRA TEST) test strip, Check blood glucose readings ACHS, Disp: 300 each, Rfl: 12 .  labetalol (NORMODYNE) 200 MG tablet, TAKE 1 TABLET BY MOUTH TWICE A DAY, Disp: 180 tablet, Rfl: 3 .  Lancets (ONETOUCH ULTRASOFT) lancets, Check blood sugar ACHS, Disp: 300 each, Rfl: 12 .  metFORMIN (GLUCOPHAGE) 500 MG tablet, Take 1063m (2 tabs) PO q am and 5074m(1 tab) q pm, Disp: 270 tablet, Rfl: 1 .   triamterene-hydrochlorothiazide (MAXZIDE-25) 37.5-25 MG tablet, TAKE 1 TABLET BY MOUTH EVERY DAY, Disp: 90 tablet, Rfl: 3   Patient Care Team: BuRubye Beachs PCP - General (Family Medicine)    Objective:    Vitals: BP 129/90 (BP Location: Left Arm, Patient Position: Sitting, Cuff Size: Large)   Pulse 87   Temp (!) 97.2 F (36.2 C) (Temporal)   Resp 16   Ht 5' 3"  (1.6 m)   Wt 160 lb 3.2 oz (72.7 kg)   BMI 28.38 kg/m    Vitals:   03/29/19 1612  BP: 129/90  Pulse: 87  Resp: 16  Temp: (!) 97.2 F (36.2 C)  TempSrc: Temporal  Weight: 160 lb 3.2 oz (72.7 kg)  Height: 5' 3"  (1.6 m)     Physical Exam Vitals reviewed.  Constitutional:      General: She is not in acute distress.    Appearance: Normal appearance. She is well-developed and overweight. She is not ill-appearing or diaphoretic.  HENT:     Head: Normocephalic and atraumatic.     Right Ear: Hearing, tympanic membrane, ear canal and external ear normal.     Left Ear: Hearing, tympanic membrane, ear canal and external ear normal.  Eyes:     General: No scleral icterus.       Right eye: No discharge.        Left eye: No discharge.     Extraocular Movements: Extraocular movements intact.     Conjunctiva/sclera: Conjunctivae normal.     Pupils: Pupils are equal, round, and reactive to light.  Neck:     Thyroid: No thyromegaly.     Vascular: No JVD.     Trachea: No tracheal deviation.  Cardiovascular:     Rate and Rhythm: Normal rate and regular rhythm.     Pulses: Normal pulses.     Heart sounds: Normal heart sounds. No murmur. No friction rub. No gallop.   Pulmonary:     Effort: Pulmonary effort is normal. No respiratory distress.     Breath sounds: Normal breath sounds. No stridor. No wheezing or rales.  Chest:     Chest wall: No tenderness.  Abdominal:     General: Abdomen is flat. Bowel sounds are normal. There is no distension.     Palpations: Abdomen is soft. There is no mass.      Tenderness: There is no abdominal tenderness. There is no guarding or rebound.  Musculoskeletal:        General: No tenderness. Normal range of motion.     Cervical back: Normal range of motion and neck supple.     Right lower leg: No edema.  Left lower leg: No edema.  Lymphadenopathy:     Cervical: No cervical adenopathy.  Skin:    General: Skin is warm and dry.     Capillary Refill: Capillary refill takes less than 2 seconds.     Findings: No rash.  Neurological:     General: No focal deficit present.     Mental Status: She is alert and oriented to person, place, and time. Mental status is at baseline.  Psychiatric:        Mood and Affect: Mood normal.        Behavior: Behavior normal.        Thought Content: Thought content normal.        Judgment: Judgment normal.    Diabetic Foot Exam - Simple   Simple Foot Form Diabetic Foot exam was performed with the following findings: Yes 03/29/2019  9:07 AM  Visual Inspection No deformities, no ulcerations, no other skin breakdown bilaterally: Yes Sensation Testing Intact to touch and monofilament testing bilaterally: Yes Pulse Check Posterior Tibialis and Dorsalis pulse intact bilaterally: Yes Comments      Depression Screen PHQ 2/9 Scores 03/29/2019 03/26/2018 03/24/2017 09/12/2016  PHQ - 2 Score 0 0 0 0  PHQ- 9 Score - - - 0       Assessment & Plan:     Routine Health Maintenance and Physical Exam  Exercise Activities and Dietary recommendations Goals   None     Immunization History  Administered Date(s) Administered  . Influenza,inj,Quad PF,6+ Mos 11/08/2018    Health Maintenance  Topic Date Due  . PNEUMOCOCCAL POLYSACCHARIDE VACCINE AGE 93-64 HIGH RISK  10/29/1980  . HIV Screening  10/29/1993  . TETANUS/TDAP  10/29/1997  . FOOT EXAM  03/24/2018  . PAP SMEAR-Modifier  03/29/2019 (Originally 04/16/2017)  . HEMOGLOBIN A1C  05/09/2019  . URINE MICROALBUMIN  11/08/2019  . OPHTHALMOLOGY EXAM  12/03/2019  .  INFLUENZA VACCINE  Completed     Discussed health benefits of physical activity, and encouraged her to engage in regular exercise appropriate for her age and condition.    1. Annual physical exam Normal physical exam today. Will check labs as below and f/u pending lab results. If labs are stable and WNL she will not need to have these rechecked for one year at her next annual physical exam. She is to call the office in the meantime if she has any acute issue, questions or concerns. - CBC with Differential/Platelet - Comprehensive metabolic panel - Lipid panel - Hemoglobin A1c - TSH  2. Type 2 diabetes mellitus without complication, unspecified whether long term insulin use (HCC) Stable. Continue Metformin 536m (10016mq am and 50081m pm). Will check labs as below and f/u pending results. - CBC with Differential/Platelet - Comprehensive metabolic panel - Lipid panel - Hemoglobin A1c - TSH  3. Essential hypertension Stable. Continue Maxzide 37.5-25mg. Will check labs as below and f/u pending results. - CBC with Differential/Platelet - Comprehensive metabolic panel - Lipid panel - Hemoglobin A1c - TSH  4. Bilateral polycystic ovarian syndrome Stable. Continue Camila. Will check labs as below and f/u pending results. - CBC with Differential/Platelet - Comprehensive metabolic panel - Lipid panel - Hemoglobin A1c - TSH  5. BMI 28.0-28.9,adult Counseled patient on healthy lifestyle modifications including dieting and exercise.  - CBC with Differential/Platelet - Comprehensive metabolic panel - Lipid panel - Hemoglobin A1c - TSH  --------------------------------------------------------------------    JenMar DaringA-C  [facility]

## 2019-04-02 ENCOUNTER — Telehealth: Payer: Self-pay

## 2019-04-02 LAB — COMPREHENSIVE METABOLIC PANEL
ALT: 9 IU/L (ref 0–32)
AST: 10 IU/L (ref 0–40)
Albumin/Globulin Ratio: 2 (ref 1.2–2.2)
Albumin: 4.8 g/dL (ref 3.8–4.8)
Alkaline Phosphatase: 79 IU/L (ref 39–117)
BUN/Creatinine Ratio: 13 (ref 9–23)
BUN: 12 mg/dL (ref 6–24)
Bilirubin Total: 0.5 mg/dL (ref 0.0–1.2)
CO2: 18 mmol/L — ABNORMAL LOW (ref 20–29)
Calcium: 9.5 mg/dL (ref 8.7–10.2)
Chloride: 100 mmol/L (ref 96–106)
Creatinine, Ser: 0.92 mg/dL (ref 0.57–1.00)
GFR calc Af Amer: 90 mL/min/{1.73_m2} (ref >59)
GFR calc non Af Amer: 78 mL/min/{1.73_m2} (ref >59)
Globulin, Total: 2.4 g/dL (ref 1.5–4.5)
Glucose: 131 mg/dL — ABNORMAL HIGH (ref 65–99)
Potassium: 3.9 mmol/L (ref 3.5–5.2)
Sodium: 136 mmol/L (ref 134–144)
Total Protein: 7.2 g/dL (ref 6.0–8.5)

## 2019-04-02 LAB — CBC WITH DIFFERENTIAL/PLATELET
Basophils Absolute: 0.1 10*3/uL (ref 0.0–0.2)
Basos: 1 %
EOS (ABSOLUTE): 0.3 10*3/uL (ref 0.0–0.4)
Eos: 3 %
Hematocrit: 42.2 % (ref 34.0–46.6)
Hemoglobin: 14.2 g/dL (ref 11.1–15.9)
Immature Grans (Abs): 0 10*3/uL (ref 0.0–0.1)
Immature Granulocytes: 0 %
Lymphocytes Absolute: 2.3 10*3/uL (ref 0.7–3.1)
Lymphs: 26 %
MCH: 29.2 pg (ref 26.6–33.0)
MCHC: 33.6 g/dL (ref 31.5–35.7)
MCV: 87 fL (ref 79–97)
Monocytes Absolute: 0.4 10*3/uL (ref 0.1–0.9)
Monocytes: 5 %
Neutrophils Absolute: 5.8 10*3/uL (ref 1.4–7.0)
Neutrophils: 65 %
Platelets: 410 10*3/uL (ref 150–450)
RBC: 4.87 x10E6/uL (ref 3.77–5.28)
RDW: 12.6 % (ref 11.7–15.4)
WBC: 9 10*3/uL (ref 3.4–10.8)

## 2019-04-02 LAB — LIPID PANEL
Chol/HDL Ratio: 4.3 ratio (ref 0.0–4.4)
Cholesterol, Total: 200 mg/dL — ABNORMAL HIGH (ref 100–199)
HDL: 47 mg/dL (ref >39)
LDL Chol Calc (NIH): 134 mg/dL — ABNORMAL HIGH (ref 0–99)
Triglycerides: 105 mg/dL (ref 0–149)
VLDL Cholesterol Cal: 19 mg/dL (ref 5–40)

## 2019-04-02 LAB — TSH: TSH: 2.12 u[IU]/mL (ref 0.450–4.500)

## 2019-04-02 LAB — HEMOGLOBIN A1C
Est. average glucose Bld gHb Est-mCnc: 126 mg/dL
Hgb A1c MFr Bld: 6 % — ABNORMAL HIGH (ref 4.8–5.6)

## 2019-04-02 NOTE — Telephone Encounter (Signed)
Result Communications   Result Notes and Comments to Patient Comment seen by patient Tracey Kennedy on 04/02/2019 8:34 AM EST

## 2019-04-02 NOTE — Telephone Encounter (Signed)
-----   Message from Margaretann Loveless, [place] sent at 04/02/2019  8:33 AM EST ----- Blood count is normal. Kidney and liver function are normal. Sodium, potassium and calcium are normal. Cholesterol did increase just slightly compared to last year. Keep working on healthy lifestyle modifications. A1c is down to 6.0 from 6.3! Great job. Thyroid is normal.

## 2019-04-07 ENCOUNTER — Ambulatory Visit: Payer: BC Managed Care – PPO | Attending: Internal Medicine

## 2019-04-07 DIAGNOSIS — Z23 Encounter for immunization: Secondary | ICD-10-CM | POA: Insufficient documentation

## 2019-04-07 NOTE — Progress Notes (Signed)
   Covid-19 Vaccination Clinic  Name:  Tracey Kennedy    MRN: [id number] DOB: Aug 15, 1980  04/07/2019  Ms. [name] was observed post Covid-19 immunization for 15 minutes without incidence. She was provided with Vaccine Information Sheet and instruction to access the V-Safe system.   Ms. [name] was instructed to call 911 with any severe reactions post vaccine: Marland Kitchen Difficulty breathing  . Swelling of your face and throat  . A fast heartbeat  . A bad rash all over your body  . Dizziness and weakness    Immunizations Administered    Name Date Dose VIS Date Route   Pfizer COVID-19 Vaccine 04/07/2019 11:45 AM 0.3 mL 01/18/2019 Intramuscular   Manufacturer: [employer], [employer]   Lot: [id number]   NDC: [address]-4

## 2019-04-12 ENCOUNTER — Encounter: Payer: Self-pay | Admitting: Physician Assistant

## 2019-04-30 ENCOUNTER — Ambulatory Visit: Payer: BC Managed Care – PPO | Attending: Internal Medicine

## 2019-04-30 ENCOUNTER — Ambulatory Visit: Payer: BC Managed Care – PPO

## 2019-04-30 DIAGNOSIS — Z23 Encounter for immunization: Secondary | ICD-10-CM

## 2019-04-30 NOTE — Progress Notes (Signed)
   Covid-19 Vaccination Clinic  Name:  Tracey Kennedy    MRN: [id number] DOB: [birth year]  04/30/2019  Ms. Dowse was observed post Covid-19 immunization for 15 minutes without incident. She was provided with Vaccine Information Sheet and instruction to access the V-Safe system.   Ms. Feltman was instructed to call 911 with any severe reactions post vaccine: Marland Kitchen Difficulty breathing  . Swelling of face and throat  . A fast heartbeat  . A bad rash all over body  . Dizziness and weakness   Immunizations Administered    Name Date Dose VIS Date Route   Pfizer COVID-19 Vaccine 04/30/2019  3:03 PM 0.3 mL 01/18/2019 Intramuscular   Manufacturer: [employer], [employer]   Lot: [id number]   NDC: [address]-4

## 2019-05-15 ENCOUNTER — Other Ambulatory Visit: Payer: Self-pay

## 2019-05-15 ENCOUNTER — Ambulatory Visit
Admission: RE | Admit: 2019-05-15 | Discharge: 2019-05-15 | Disposition: A | Discharge status: Home / Self Care | Payer: BC Managed Care – PPO | Source: Ambulatory Visit | Attending: Obstetrics and Gynecology | Admitting: Obstetrics and Gynecology

## 2019-05-15 ENCOUNTER — Other Ambulatory Visit: Payer: Self-pay | Admitting: Obstetrics and Gynecology

## 2019-05-15 DIAGNOSIS — N6489 Other specified disorders of breast: Secondary | ICD-10-CM

## 2019-06-14 ENCOUNTER — Other Ambulatory Visit: Payer: Self-pay | Admitting: Physician Assistant

## 2019-06-14 DIAGNOSIS — E119 Type 2 diabetes mellitus without complications: Secondary | ICD-10-CM

## 2019-06-14 DIAGNOSIS — E282 Polycystic ovarian syndrome: Secondary | ICD-10-CM

## 2019-09-29 ENCOUNTER — Other Ambulatory Visit: Payer: Self-pay | Admitting: Physician Assistant

## 2019-09-29 DIAGNOSIS — E119 Type 2 diabetes mellitus without complications: Secondary | ICD-10-CM

## 2019-09-29 DIAGNOSIS — E282 Polycystic ovarian syndrome: Secondary | ICD-10-CM

## 2019-09-29 NOTE — Telephone Encounter (Signed)
Requested medication (s) are due for refill today: yes  Requested medication (s) are on the active medication list: yes  Last refill:  04/01/19  Future visit scheduled: no  Notes to clinic:  called pt and LM to call back to make appt/labs overdue   Requested Prescriptions  Pending Prescriptions Disp Refills   metFORMIN (GLUCOPHAGE) 500 MG tablet [Pharmacy Med Name: METFORMIN HCL 500 MG TABLET] 270 tablet 0    Sig: TAKE 2 TABLETS BY MOUTH EVERY MORNING AND 1 TABLET EVERY Baker      Endocrinology:  Diabetes - Biguanides Failed - 09/29/2019  9:39 AM      Failed - HBA1C is between 0 and 7.9 and within 180 days    Hgb A1c MFr Bld  Date Value Ref Range Status  04/01/2019 6.0 (H) 4.8 - 5.6 % Final    Comment:             Prediabetes: 5.7 - 6.4          Diabetes: >6.4          Glycemic control for adults with diabetes: <7.0           Failed - Valid encounter within last 6 months    Recent Outpatient Visits           6 months ago Annual physical exam   [facility] Fenton Malling M, PA-C   10 months ago Type 2 diabetes mellitus without complication, unspecified whether long term insulin use [facility])   [place], Clearnce Sorrel, [place]   1 year ago Annual physical exam   [place], Clearnce Sorrel, [place]   2 years ago Sore throat   [facility] Carles Collet M, [place]   2 years ago Annual physical exam   [facility] Fenton Malling M, [place]              Passed - Cr in normal range and within 360 days    Creatinine  Date Value Ref Range Status  04/17/2012 0.78 0.60 - 1.30 mg/dL Final   Creatinine, Ser  Date Value Ref Range Status  04/01/2019 0.92 0.57 - 1.00 mg/dL Final          Passed - eGFR in normal range and within 360 days    EGFR (African American)  Date Value Ref Range Status  04/17/2012 >60  Final   GFR calc Af Amer  Date Value Ref Range Status  04/01/2019 90  >59 mL/min/1.73 Final   EGFR (Non-African Amer.)  Date Value Ref Range Status  04/17/2012 >60  Final    Comment:    eGFR values <52m/min/1.73 m2 may be an indication of chronic kidney disease (CKD). Calculated eGFR is useful in patients with stable renal function. The eGFR calculation will not be reliable in acutely ill patients when serum creatinine is changing rapidly. It is not useful in  patients on dialysis. The eGFR calculation may not be applicable to patients at the low and high extremes of body sizes, pregnant women, and vegetarians.    GFR calc non Af Amer  Date Value Ref Range Status  04/01/2019 78 >59 mL/min/1.73 Final

## 2019-11-15 ENCOUNTER — Other Ambulatory Visit: Payer: Self-pay | Admitting: Obstetrics and Gynecology

## 2019-11-15 ENCOUNTER — Ambulatory Visit
Admission: RE | Admit: 2019-11-15 | Discharge: 2019-11-15 | Disposition: A | Discharge status: Home / Self Care | Payer: BC Managed Care – PPO | Source: Ambulatory Visit | Attending: Obstetrics and Gynecology | Admitting: Obstetrics and Gynecology

## 2019-11-15 ENCOUNTER — Other Ambulatory Visit: Payer: Self-pay

## 2019-11-15 DIAGNOSIS — N6310 Unspecified lump in the right breast, unspecified quadrant: Secondary | ICD-10-CM

## 2019-11-15 DIAGNOSIS — N6489 Other specified disorders of breast: Secondary | ICD-10-CM

## 2019-11-29 ENCOUNTER — Ambulatory Visit
Admission: RE | Admit: 2019-11-29 | Discharge: 2019-11-29 | Disposition: A | Discharge status: Home / Self Care | Payer: BC Managed Care – PPO | Source: Ambulatory Visit | Attending: Obstetrics and Gynecology | Admitting: Obstetrics and Gynecology

## 2019-11-29 DIAGNOSIS — N6310 Unspecified lump in the right breast, unspecified quadrant: Secondary | ICD-10-CM

## 2019-12-07 ENCOUNTER — Other Ambulatory Visit: Payer: Self-pay | Admitting: Physician Assistant

## 2019-12-07 DIAGNOSIS — I10 Essential (primary) hypertension: Secondary | ICD-10-CM

## 2019-12-07 NOTE — Telephone Encounter (Signed)
Requested Prescriptions  Pending Prescriptions Disp Refills  . triamterene-hydrochlorothiazide (MAXZIDE-25) 37.5-25 MG tablet [Pharmacy Med Name: TRIAMTERENE-HCTZ 37.5-25 MG TB] 30 tablet 0    Sig: TAKE 1 TABLET BY MOUTH EVERY DAY     Cardiovascular: Diuretic Combos Failed - 12/07/2019  8:45 AM      Failed - Last BP in normal range    BP Readings from Last 1 Encounters:  03/29/19 129/90         Failed - Valid encounter within last 6 months    Recent Outpatient Visits          8 months ago Annual physical exam   [facility] [place], Alessandra Bevels, PA-C   1 year ago Type 2 diabetes mellitus without complication, unspecified whether long term insulin use [facility])   [facility] [place], [place], [place]   1 year ago Annual physical exam   [facility] Joycelyn Man M, [place]   2 years ago Sore throat   [facility] Osvaldo Angst M, [place]   2 years ago Annual physical exam   [facility] Joycelyn Man M, [place]             Passed - K in normal range and within 360 days    Potassium  Date Value Ref Range Status  04/01/2019 3.9 3.5 - 5.2 mmol/L Final  04/17/2012 3.5 3.5 - 5.1 mmol/L Final         Passed - Na in normal range and within 360 days    Sodium  Date Value Ref Range Status  04/01/2019 136 134 - 144 mmol/L Final  04/17/2012 136 136 - 145 mmol/L Final         Passed - Cr in normal range and within 360 days    Creatinine  Date Value Ref Range Status  04/17/2012 0.78 0.60 - 1.30 mg/dL Final   Creatinine, Ser  Date Value Ref Range Status  04/01/2019 0.92 0.57 - 1.00 mg/dL Final         Passed - Ca in normal range and within 360 days    Calcium  Date Value Ref Range Status  04/01/2019 9.5 8.7 - 10.2 mg/dL Final   Calcium, Total  Date Value Ref Range Status  04/17/2012 8.6 8.5 - 10.1 mg/dL Final         . labetalol (NORMODYNE) 200 MG tablet [Pharmacy Med Name: LABETALOL HCL 200 MG  TABLET] 180 tablet 3    Sig: TAKE 1 TABLET BY MOUTH TWICE A DAY     Cardiovascular:  Beta Blockers Failed - 12/07/2019  8:45 AM      Failed - Last BP in normal range    BP Readings from Last 1 Encounters:  03/29/19 129/90         Failed - Valid encounter within last 6 months    Recent Outpatient Visits          8 months ago Annual physical exam   [facility] [place], Alessandra Bevels, [place]   1 year ago Type 2 diabetes mellitus without complication, unspecified whether long term insulin use [facility])   [facility] [place], Alessandra Bevels, [place]   1 year ago Annual physical exam   [facility] [place], [place], [place]   2 years ago Sore throat   [facility] Trey Sailors, [place]   2 years ago Annual physical exam   [facility] Joycelyn Man [place], [place]  Passed - Last Heart Rate in normal range    Pulse Readings from Last 1 Encounters:  03/29/19 87

## 2019-12-18 ENCOUNTER — Other Ambulatory Visit: Payer: Self-pay | Admitting: Physician Assistant

## 2019-12-18 DIAGNOSIS — I10 Essential (primary) hypertension: Secondary | ICD-10-CM

## 2019-12-18 NOTE — Telephone Encounter (Signed)
  Notes to clinic Already had a curtesy refill of 6 pills, no visit is scheduled, last visit 03/2019.

## 2019-12-26 ENCOUNTER — Encounter: Payer: Self-pay | Admitting: Physician Assistant

## 2020-01-03 ENCOUNTER — Other Ambulatory Visit: Payer: Self-pay | Admitting: Physician Assistant

## 2020-01-03 DIAGNOSIS — I10 Essential (primary) hypertension: Secondary | ICD-10-CM

## 2020-01-03 NOTE — Telephone Encounter (Signed)
Requested Prescriptions  Pending Prescriptions Disp Refills   triamterene-hydrochlorothiazide (MAXZIDE-25) 37.5-25 MG tablet [Pharmacy Med Name: TRIAMTERENE-HCTZ 37.5-25 MG TB] 30 tablet 0    Sig: TAKE 1 TABLET BY MOUTH EVERY DAY     Cardiovascular: Diuretic Combos Failed - 01/03/2020  1:26 AM      Failed - Last BP in normal range    BP Readings from Last 1 Encounters:  03/29/19 129/90         Failed - Valid encounter within last 6 months    Recent Outpatient Visits          9 months ago Annual physical exam   [facility] [place], Alessandra Bevels, PA-C   1 year ago Type 2 diabetes mellitus without complication, unspecified whether long term insulin use [facility])   [facility] [place], [place], [place]   1 year ago Annual physical exam   [facility] Joycelyn Man M, [place]   2 years ago Sore throat   [facility] Osvaldo Angst M, [place]   2 years ago Annual physical exam   [facility] Joycelyn Man M, [place]             Passed - K in normal range and within 360 days    Potassium  Date Value Ref Range Status  04/01/2019 3.9 3.5 - 5.2 mmol/L Final  04/17/2012 3.5 3.5 - 5.1 mmol/L Final         Passed - Na in normal range and within 360 days    Sodium  Date Value Ref Range Status  04/01/2019 136 134 - 144 mmol/L Final  04/17/2012 136 136 - 145 mmol/L Final         Passed - Cr in normal range and within 360 days    Creatinine  Date Value Ref Range Status  04/17/2012 0.78 0.60 - 1.30 mg/dL Final   Creatinine, Ser  Date Value Ref Range Status  04/01/2019 0.92 0.57 - 1.00 mg/dL Final         Passed - Ca in normal range and within 360 days    Calcium  Date Value Ref Range Status  04/01/2019 9.5 8.7 - 10.2 mg/dL Final   Calcium, Total  Date Value Ref Range Status  04/17/2012 8.6 8.5 - 10.1 mg/dL Final          Phone call to pt.  Advised of need for 6 mo. F/u appt.  Pt. Stated she has had  COVID, and is just completing her 10 d. quarantine today.  Reported due to daughter's quarantine, she cannot schedule an appt. For a couple of weeks.  Enc. Pt. To call in 2 weeks to schedule f/u appt.  Pt. Verb. Understanding.  Courtesy refill given at this time, per protocol.

## 2020-01-14 ENCOUNTER — Other Ambulatory Visit: Payer: Self-pay | Admitting: Physician Assistant

## 2020-01-14 DIAGNOSIS — I10 Essential (primary) hypertension: Secondary | ICD-10-CM

## 2020-01-14 NOTE — Telephone Encounter (Signed)
Requested medications are due for refill today yes  Requested medications are on the active medication list yes  Last refill 11/11  Last visit 03/2019  Future visit scheduled no  Notes to clinic Was given one month rx 3 weeks ago with instructions to make appt, no upcoming appt scheduled.

## 2020-01-30 ENCOUNTER — Other Ambulatory Visit: Payer: Self-pay | Admitting: Physician Assistant

## 2020-01-30 DIAGNOSIS — I10 Essential (primary) hypertension: Secondary | ICD-10-CM

## 2020-02-14 ENCOUNTER — Ambulatory Visit: Payer: BC Managed Care – PPO | Admitting: Physician Assistant

## 2020-02-24 ENCOUNTER — Other Ambulatory Visit: Payer: Self-pay | Admitting: Physician Assistant

## 2020-02-24 ENCOUNTER — Encounter: Payer: Self-pay | Admitting: Physician Assistant

## 2020-02-24 ENCOUNTER — Telehealth (INDEPENDENT_AMBULATORY_CARE_PROVIDER_SITE_OTHER): Payer: BC Managed Care – PPO | Admitting: Physician Assistant

## 2020-02-24 DIAGNOSIS — I10 Essential (primary) hypertension: Secondary | ICD-10-CM

## 2020-02-24 DIAGNOSIS — E282 Polycystic ovarian syndrome: Secondary | ICD-10-CM | POA: Diagnosis not present

## 2020-02-24 DIAGNOSIS — E119 Type 2 diabetes mellitus without complications: Secondary | ICD-10-CM | POA: Diagnosis not present

## 2020-02-24 MED ORDER — METFORMIN HCL 500 MG PO TABS: ORAL_TABLET | ORAL | 0 refills | Status: DC

## 2020-02-24 MED ORDER — LABETALOL HCL 200 MG PO TABS: ORAL_TABLET | ORAL | 0 refills | Status: DC

## 2020-02-24 MED ORDER — TRIAMTERENE-HCTZ 37.5-25 MG PO TABS: 1.0000 | ORAL_TABLET | Freq: Every day | ORAL | 0 refills | Status: DC

## 2020-02-24 NOTE — Progress Notes (Signed)
MyChart Video Visit    Virtual Visit via Video Note   This visit type was conducted due to national recommendations for restrictions regarding the COVID-19 Pandemic (e.g. social distancing) in an effort to limit this patient's exposure and mitigate transmission in our community. This patient is at least at moderate risk for complications without adequate follow up. This format is felt to be most appropriate for this patient at this time. Physical exam was limited by quality of the video and audio technology used for the visit.   Patient location: Home Provider location: Home office in [place]  I discussed the limitations of evaluation and management by telemedicine and the availability of in person appointments. The patient expressed understanding and agreed to proceed.  Patient: Tracey Kennedy   DOB: Jun 12, 1980   40 y.o. Female  MRN: [id number] Visit Date: 02/24/2020  Today's healthcare provider: Margaretann Loveless, PA-C   No chief complaint on file.  Subjective    HPI  Diabetes Mellitus Type II, follow-up  Lab Results  Component Value Date   HGBA1C 6.0 (H) 04/01/2019   HGBA1C 6.3 (A) 11/08/2018   HGBA1C 6.5 (H) 03/26/2018   Last seen for diabetes 11 months ago.  Management since then includes continuing the same treatment. She reports excellent compliance with treatment. She is not having side effects.   Home blood sugar records: fasting range: 110-120  Episodes of hypoglycemia? No    Current insulin regiment: None Most Recent Eye Exam: UTD  --------------------------------------------------------------------------------------------------- Hypertension, follow-up  BP Readings from Last 3 Encounters:  03/29/19 129/90  11/08/18 (!) 131/91  03/26/18 132/88   Wt Readings from Last 3 Encounters:  03/29/19 160 lb 3.2 oz (72.7 kg)  11/08/18 163 lb 3.2 oz (74 kg)  03/26/18 173 lb (78.5 kg)     She was last seen for hypertension 11 months ago.   Management since that visit includes no changes. She reports excellent compliance with treatment. She is not having side effects.  She is exercising. She is adherent to low salt diet.   Outside blood pressures are normal at home.  She does not smoke.  Use of agents associated with hypertension: none.    Last metabolic panel Lab Results  Component Value Date   GLUCOSE 131 (H) 04/01/2019   NA 136 04/01/2019   K 3.9 04/01/2019   BUN 12 04/01/2019   CREATININE 0.92 04/01/2019   GFRNONAA 78 04/01/2019   GFRAA 90 04/01/2019   CALCIUM 9.5 04/01/2019   AST 10 04/01/2019   ALT 9 04/01/2019   The 10-year ASCVD risk score Denman George DC Jr., et al., 2013) is: 2.2%  ---------------------------------------------------------------------------------------------------   Patient Active Problem List   Diagnosis Date Noted  . Bilateral polycystic ovarian syndrome 09/11/2015  . BMI 28.0-28.9,adult 09/11/2015  . Essential hypertension 10/20/2014  . Diabetes (HCC) 10/20/2014  . Calculus of gallbladder 04/15/2008   Past Medical History:  Diagnosis Date  . Diabetes mellitus without complication (HCC)   . Hypertension    Social History   Tobacco Use  . Smoking status: Never Smoker  . Smokeless tobacco: Never Used  Substance Use Topics  . Alcohol use: No  . Drug use: No   Allergies  Allergen Reactions  . Penicillins     Medications: Outpatient Medications Prior to Visit  Medication Sig  . CAMILA 0.35 MG tablet Take 1 tablet by mouth daily.  Marland Kitchen glucose blood (ONE TOUCH ULTRA TEST) test strip Check blood glucose readings ACHS  . labetalol (  NORMODYNE) 200 MG tablet TAKE 1 TABLET BY MOUTH 2 (TWO) TIMES DAILY. PLEASE SCHEDULE AN OFFICE VISIT BEFORE ANYMORE REFILLS.  Marland Kitchen Lancets (ONETOUCH ULTRASOFT) lancets Check blood sugar ACHS  . metFORMIN (GLUCOPHAGE) 500 MG tablet TAKE 2 TABLETS BY MOUTH EVERY MORNING AND 1 TABLET EVERY EVERY EVENING  . triamterene-hydrochlorothiazide (MAXZIDE-25)  37.5-25 MG tablet TAKE 1 TABLET BY MOUTH EVERY DAY   No facility-administered medications prior to visit.    Review of Systems  Constitutional: Negative.   Respiratory: Negative.   Cardiovascular: Negative.   Gastrointestinal: Negative.   Endocrine: Negative.   Skin: Negative for wound.  Neurological: Negative for dizziness, light-headedness, numbness and headaches.      Objective    There were no vitals taken for this visit.   Physical Exam Vitals reviewed.  Constitutional:      General: She is not in acute distress.    Appearance: Normal appearance. She is well-developed and well-nourished. She is not ill-appearing.  HENT:     Head: Normocephalic and atraumatic.  Eyes:     Extraocular Movements: EOM normal.  Pulmonary:     Effort: Pulmonary effort is normal. No respiratory distress.  Musculoskeletal:     Cervical back: Normal range of motion and neck supple.  Neurological:     Mental Status: She is alert.  Psychiatric:        Mood and Affect: Mood and affect and mood normal.        Behavior: Behavior normal.        Thought Content: Thought content normal.        Judgment: Judgment normal.        Assessment & Plan     1. Essential hypertension Stable. Diagnosis pulled for medication refill. Continue current medical treatment plan. Patient will return in one month for CPE and labs at that time.  - labetalol (NORMODYNE) 200 MG tablet; TAKE 1 TABLET BY MOUTH 2 (TWO) TIMES DAILY.  Dispense: 180 tablet; Refill: 0 - triamterene-hydrochlorothiazide (MAXZIDE-25) 37.5-25 MG tablet; Take 1 tablet by mouth daily.  Dispense: 90 tablet; Refill: 0  2. Type 2 diabetes mellitus without complication, unspecified whether long term insulin use (HCC) Stable. Diagnosis pulled for medication refill. Continue current medical treatment plan. - metFORMIN (GLUCOPHAGE) 500 MG tablet; TAKE 2 TABLETS BY MOUTH EVERY MORNING AND 1 TABLET EVERY EVERY EVENING  Dispense: 270 tablet; Refill:  0  3. Bilateral polycystic ovarian syndrome Stable. Diagnosis pulled for medication refill. Continue current medical treatment plan. - metFORMIN (GLUCOPHAGE) 500 MG tablet; TAKE 2 TABLETS BY MOUTH EVERY MORNING AND 1 TABLET EVERY EVERY EVENING  Dispense: 270 tablet; Refill: 0   No follow-ups on file.     I discussed the assessment and treatment plan with the patient. The patient was provided an opportunity to ask questions and all were answered. The patient agreed with the plan and demonstrated an understanding of the instructions.   The patient was advised to call back or seek an in-person evaluation if the symptoms worsen or if the condition fails to improve as anticipated.  I provided 12 minutes of face-to-face time during this encounter via MyChart Video enabled encounter.  Delmer Islam, PA-C, have reviewed all documentation for this visit. The documentation on 02/24/20 for the exam, diagnosis, procedures, and orders are all accurate and complete.  Reine Just [facility] [phone] (phone) [phone] (fax)  [facility]

## 2020-04-01 ENCOUNTER — Ambulatory Visit (INDEPENDENT_AMBULATORY_CARE_PROVIDER_SITE_OTHER): Payer: BC Managed Care – PPO | Admitting: Physician Assistant

## 2020-04-01 ENCOUNTER — Encounter: Payer: Self-pay | Admitting: Physician Assistant

## 2020-04-01 ENCOUNTER — Other Ambulatory Visit: Payer: Self-pay

## 2020-04-01 VITALS — BP 130/90 | HR 87 | Temp 98.3°F | Ht 63.0 in | Wt 153.0 lb

## 2020-04-01 DIAGNOSIS — E119 Type 2 diabetes mellitus without complications: Secondary | ICD-10-CM | POA: Diagnosis not present

## 2020-04-01 DIAGNOSIS — Z1159 Encounter for screening for other viral diseases: Secondary | ICD-10-CM

## 2020-04-01 DIAGNOSIS — Z1239 Encounter for other screening for malignant neoplasm of breast: Secondary | ICD-10-CM

## 2020-04-01 DIAGNOSIS — I10 Essential (primary) hypertension: Secondary | ICD-10-CM

## 2020-04-01 DIAGNOSIS — Z0000 Encounter for general adult medical examination without abnormal findings: Secondary | ICD-10-CM

## 2020-04-01 DIAGNOSIS — Z114 Encounter for screening for human immunodeficiency virus [HIV]: Secondary | ICD-10-CM

## 2020-04-01 DIAGNOSIS — Z6827 Body mass index (BMI) 27.0-27.9, adult: Secondary | ICD-10-CM | POA: Insufficient documentation

## 2020-04-01 LAB — HM DIABETES EYE EXAM

## 2020-04-01 NOTE — Patient Instructions (Signed)
Preventive Care 42-42 Years Old, Female Preventive care refers to lifestyle choices and visits with your health care provider that can promote health and wellness. This includes:  A yearly physical exam. This is also called an annual wellness visit.  Regular dental and eye exams.  Immunizations.  Screening for certain conditions.  Healthy lifestyle choices, such as: ? Eating a healthy diet. ? Getting regular exercise. ? Not using drugs or products that contain nicotine and tobacco. ? Limiting alcohol use. What can I expect for my preventive care visit? Physical exam Your health care provider will check your:  Height and weight. These may be used to calculate your BMI (body mass index). BMI is a measurement that tells if you are at a healthy weight.  Heart rate and blood pressure.  Body temperature.  Skin for abnormal spots. Counseling Your health care provider may ask you questions about your:  Past medical problems.  Family's medical history.  Alcohol, tobacco, and drug use.  Emotional well-being.  Home life and relationship well-being.  Sexual activity.  Diet, exercise, and sleep habits.  Work and work Statistician.  Access to firearms.  Method of birth control.  Menstrual cycle.  Pregnancy history. What immunizations do I need? Vaccines are usually given at various ages, according to a schedule. Your health care provider will recommend vaccines for you based on your age, medical history, and lifestyle or other factors, such as travel or where you work.   What tests do I need? Blood tests  Lipid and cholesterol levels. These may be checked every 5 years, or more often if you are over 3 years old.  Hepatitis C test.  Hepatitis B test. Screening  Lung cancer screening. You may have this screening every year starting at age 73 if you have a 30-pack-year history of smoking and currently smoke or have quit within the past 15 years.  Colorectal cancer  screening. ? All adults should have this screening starting at age 52 and continuing until age 17. ? Your health care provider may recommend screening at age 49 if you are at increased risk. ? You will have tests every 1-10 years, depending on your results and the type of screening test.  Diabetes screening. ? This is done by checking your blood sugar (glucose) after you have not eaten for a while (fasting). ? You may have this done every 1-3 years.  Mammogram. ? This may be done every 1-2 years. ? Talk with your health care provider about when you should start having regular mammograms. This may depend on whether you have a family history of breast cancer.  BRCA-related cancer screening. This may be done if you have a family history of breast, ovarian, tubal, or peritoneal cancers.  Pelvic exam and Pap test. ? This may be done every 3 years starting at age 10. ? Starting at age 11, this may be done every 5 years if you have a Pap test in combination with an HPV test. Other tests  STD (sexually transmitted disease) testing, if you are at risk.  Bone density scan. This is done to screen for osteoporosis. You may have this scan if you are at high risk for osteoporosis. Talk with your health care provider about your test results, treatment options, and if necessary, the need for more tests. Follow these instructions at home: Eating and drinking  Eat a diet that includes fresh fruits and vegetables, whole grains, lean protein, and low-fat dairy products.  Take vitamin and mineral supplements  as recommended by your health care provider.  Do not drink alcohol if: ? Your health care provider tells you not to drink. ? You are pregnant, may be pregnant, or are planning to become pregnant.  If you drink alcohol: ? Limit how much you have to 0-1 drink a day. ? Be aware of how much alcohol is in your drink. In the [place] one drink equals one 12 oz bottle of beer (355 mL), one 5 oz glass of  wine (148 mL), or one 1 oz glass of hard liquor (44 mL).   Lifestyle  Take daily care of your teeth and gums. Brush your teeth every morning and night with fluoride toothpaste. Floss one time each day.  Stay active. Exercise for at least 30 minutes 5 or more days each week.  Do not use any products that contain nicotine or tobacco, such as cigarettes, e-cigarettes, and chewing tobacco. If you need help quitting, ask your health care provider.  Do not use drugs.  If you are sexually active, practice safe sex. Use a condom or other form of protection to prevent STIs (sexually transmitted infections).  If you do not wish to become pregnant, use a form of birth control. If you plan to become pregnant, see your health care provider for a prepregnancy visit.  If told by your health care provider, take low-dose aspirin daily starting at age 50.  Find healthy ways to cope with stress, such as: ? Meditation, yoga, or listening to music. ? Journaling. ? Talking to a trusted person. ? Spending time with friends and family. Safety  Always wear your seat belt while driving or riding in a vehicle.  Do not drive: ? If you have been drinking alcohol. Do not ride with someone who has been drinking. ? When you are tired or distracted. ? While texting.  Wear a helmet and other protective equipment during sports activities.  If you have firearms in your house, make sure you follow all gun safety procedures. What's next?  Visit your health care provider once a year for an annual wellness visit.  Ask your health care provider how often you should have your eyes and teeth checked.  Stay up to date on all vaccines. This information is not intended to replace advice given to you by your health care provider. Make sure you discuss any questions you have with your health care provider. Document Revised: 10/29/2019 Document Reviewed: 10/05/2017 Elsevier Patient Education  2021 [employer]  

## 2020-04-01 NOTE — Progress Notes (Signed)
Complete physical exam   Patient: Tracey Kennedy   DOB: March 02, 1980   40 y.o. Female  MRN: [id number] Visit Date: 04/01/2020  Today's healthcare provider: Mar Daring, PA-C   Chief Complaint  Patient presents with  . Annual Exam   Subjective    Tracey Kennedy is a 40 y.o. female who presents today for a complete physical exam.  She reports consuming a general diet. The patient does not participate in regular exercise at present. She generally feels well. She reports sleeping well. She does not have additional problems to discuss today.  HPI    Past Medical History:  Diagnosis Date  . Diabetes mellitus without complication ([place])   . Hypertension    Past Surgical History:  Procedure Laterality Date  . CHOLECYSTECTOMY     Social History   Socioeconomic History  . Marital status: Married    Spouse name: Not on file  . Number of children: Not on file  . Years of education: Not on file  . Highest education level: Not on file  Occupational History  . Not on file  Tobacco Use  . Smoking status: Never Smoker  . Smokeless tobacco: Never Used  Vaping Use  . Vaping Use: Never used  Substance and Sexual Activity  . Alcohol use: No  . Drug use: No  . Sexual activity: Yes  Other Topics Concern  . Not on file  Social History Narrative  . Not on file   Social Determinants of Health   Financial Resource Strain: Not on file  Food Insecurity: Not on file  Transportation Needs: Not on file  Physical Activity: Not on file  Stress: Not on file  Social Connections: Not on file  Intimate Partner Violence: Not on file   Family Status  Relation Name Status  . Mother  Deceased       2006/05/01 of multiple myeloma  . Father  Alive  . Sister  Alive  . Brother  Alive  . PGM  Deceased  . PGF  Deceased  . H Brother  Alive  . H Brother  Alive  . H Sister  Alive   Family History  Problem Relation Age of Onset  . Multiple myeloma Mother   .  Diabetes Mother   . Hypertension Father   . Hyperlipidemia Father   . Diabetes Father   . Cirrhosis Father   . Polycystic ovary syndrome Sister   . Healthy Brother   . Bone cancer Paternal Grandmother   . Heart disease Paternal Grandfather   . Stroke Paternal Grandfather   . Healthy Half-Brother   . Healthy Half-Brother   . Healthy Half-Sister    Allergies  Allergen Reactions  . Penicillins     Patient Care Team: Rubye Beach as PCP - General (Family Medicine)   Medications: Outpatient Medications Prior to Visit  Medication Sig  . CAMILA 0.35 MG tablet Take 1 tablet by mouth daily.  Marland Kitchen glucose blood (ONE TOUCH ULTRA TEST) test strip Check blood glucose readings ACHS  . labetalol (NORMODYNE) 200 MG tablet TAKE 1 TABLET BY MOUTH 2 (TWO) TIMES DAILY.  Marland Kitchen Lancets (ONETOUCH ULTRASOFT) lancets Check blood sugar ACHS  . metFORMIN (GLUCOPHAGE) 500 MG tablet TAKE 2 TABLETS BY MOUTH EVERY MORNING AND 1 TABLET EVERY EVERY EVENING  . triamterene-hydrochlorothiazide (MAXZIDE-25) 37.5-25 MG tablet Take 1 tablet by mouth daily.   No facility-administered medications prior to visit.    Review of Systems  Constitutional: Negative.  HENT: Negative.   Eyes: Negative.   Respiratory: Negative.   Cardiovascular: Negative.   Gastrointestinal: Negative.   Endocrine: Negative.   Genitourinary: Negative.   Musculoskeletal: Negative.   Skin: Negative.   Allergic/Immunologic: Negative.   Neurological: Negative.   Hematological: Negative.   Psychiatric/Behavioral: Negative.       Objective    BP 130/90 (BP Location: Left Arm, Patient Position: Sitting, Cuff Size: Large)   Pulse 87   Temp 98.3 F (36.8 C) (Oral)   Ht 5' 3"  (1.6 m)   Wt 153 lb (69.4 kg)   BMI 27.10 kg/m    Physical Exam Vitals reviewed.  Constitutional:      General: She is not in acute distress.    Appearance: Normal appearance. She is well-developed, well-groomed, overweight and well-nourished. She  is not diaphoretic.  HENT:     Head: Normocephalic and atraumatic.     Right Ear: Tympanic membrane, ear canal and external ear normal.     Left Ear: Tympanic membrane, ear canal and external ear normal.     Nose: Nose normal.     Mouth/Throat:     Mouth: Oropharynx is clear and moist. Mucous membranes are moist.     Pharynx: Oropharynx is clear. No oropharyngeal exudate or posterior oropharyngeal erythema.  Eyes:     General: No scleral icterus.       Right eye: No discharge.        Left eye: No discharge.     Extraocular Movements: Extraocular movements intact and EOM normal.     Conjunctiva/sclera: Conjunctivae normal.     Pupils: Pupils are equal, round, and reactive to light.  Neck:     Thyroid: No thyromegaly.     Vascular: No carotid bruit or JVD.     Trachea: No tracheal deviation.  Cardiovascular:     Rate and Rhythm: Normal rate and regular rhythm.     Pulses: Normal pulses and intact distal pulses.     Heart sounds: Normal heart sounds. No murmur heard. No friction rub. No gallop.   Pulmonary:     Effort: Pulmonary effort is normal. No respiratory distress.     Breath sounds: Normal breath sounds. No wheezing or rales.  Chest:     Chest wall: No tenderness.  Abdominal:     General: Abdomen is flat. Bowel sounds are normal. There is no distension.     Palpations: Abdomen is soft. There is no mass.     Tenderness: There is no abdominal tenderness. There is no guarding or rebound.  Musculoskeletal:        General: No tenderness or edema. Normal range of motion.     Cervical back: Normal range of motion and neck supple. No tenderness.     Right lower leg: No edema.     Left lower leg: No edema.  Lymphadenopathy:     Cervical: No cervical adenopathy.  Skin:    General: Skin is warm and dry.     Capillary Refill: Capillary refill takes less than 2 seconds.     Findings: No rash.  Neurological:     General: No focal deficit present.     Mental Status: She is alert  and oriented to person, place, and time. Mental status is at baseline.  Psychiatric:        Mood and Affect: Mood and affect and mood normal.        Behavior: Behavior normal. Behavior is cooperative.  Thought Content: Thought content normal.        Judgment: Judgment normal.      Last depression screening scores PHQ 2/9 Scores 03/29/2019 03/26/2018 03/24/2017  PHQ - 2 Score 0 0 0  PHQ- 9 Score - - -   Last fall risk screening Fall Risk  03/29/2019  Falls in the past year? 0  Number falls in past yr: 0  Injury with Fall? 0  Follow up Falls evaluation completed   Last Audit-C alcohol use screening Alcohol Use Disorder Test (AUDIT) 03/29/2019  1. How often do you have a drink containing alcohol? 1  2. How many drinks containing alcohol do you have on a typical day when you are drinking? 0  3. How often do you have six or more drinks on one occasion? 0  AUDIT-C Score 1  Alcohol Brief Interventions/Follow-up AUDIT Score <7 follow-up not indicated   A score of 3 or more in women, and 4 or more in men indicates increased risk for alcohol abuse, EXCEPT if all of the points are from question 1   No results found for any visits on 04/01/20.  Assessment & Plan    Routine Health Maintenance and Physical Exam  Exercise Activities and Dietary recommendations Goals   None     Immunization History  Administered Date(s) Administered  . Influenza,inj,Quad PF,6+ Mos 11/08/2018  . PFIZER(Purple Top)SARS-COV-2 Vaccination 04/07/2019, 04/30/2019, 02/18/2020    Health Maintenance  Topic Date Due  . Hepatitis C Screening  Never done  . PNEUMOCOCCAL POLYSACCHARIDE VACCINE AGE 7-64 HIGH RISK  Never done  . TETANUS/TDAP  Never done  . PAP SMEAR-Modifier  04/16/2017  . HEMOGLOBIN A1C  09/29/2019  . URINE MICROALBUMIN  11/08/2019  . OPHTHALMOLOGY EXAM  12/03/2019  . FOOT EXAM  03/28/2020  . HIV Screening  04/01/2020 (Originally 10/29/1993)  . INFLUENZA VACCINE  05/07/2020 (Originally  09/08/2019)  . COVID-19 Vaccine  Completed    Discussed health benefits of physical activity, and encouraged her to engage in regular exercise appropriate for her age and condition.  1. Annual physical exam Normal physical exam today. Will check labs as below and f/u pending lab results. If labs are stable and WNL she will not need to have these rechecked for one year at her next annual physical exam. She is to call the office in the meantime if she has any acute issue, questions or concerns. - CBC w/Diff/Platelet - Comprehensive Metabolic Panel (CMET) - TSH - HgB A1c - Lipid Panel With LDL/HDL Ratio  2. Encounter for breast cancer screening using non-mammogram modality Mammogram scheduled tomorrow.  3. Type 2 diabetes mellitus without complication, unspecified whether long term insulin use (HCC) Stable. Continue Metformin 536m 2 tablets in the morning and 1 in the evening. Will check labs as below and f/u pending results. - Comprehensive Metabolic Panel (CMET) - HgB A1c - Lipid Panel With LDL/HDL Ratio  4. Essential hypertension Stable. Continue Labetalol 2039mBID. Will check labs as below and f/u pending results. - Comprehensive Metabolic Panel (CMET) - HgB A1c - Lipid Panel With LDL/HDL Ratio  5. BMI 27.0-27.9,adult Counseled patient on healthy lifestyle modifications including dieting and exercise.  Patient doing intermittent fasting.  - CBC w/Diff/Platelet - Comprehensive Metabolic Panel (CMET) - TSH - HgB A1c - Lipid Panel With LDL/HDL Ratio  6. Encounter for hepatitis C screening test for low risk patient Will check labs as below and f/u pending results. - Hepatitis C Antibody  7. Screening for HIV without presence  of risk factors Will check labs as below and f/u pending results. - HIV antibody (with reflex)   No follow-ups on file.     Reynolds Bowl, PA-C, have reviewed all documentation for this visit. The documentation on 04/01/20 for the exam,  diagnosis, procedures, and orders are all accurate and complete.   Rubye Beach  [facility] [phone] (phone) [phone] (fax)  [place]

## 2020-04-02 LAB — CBC WITH DIFFERENTIAL/PLATELET
Basophils Absolute: 0.1 10*3/uL (ref 0.0–0.2)
Basos: 1 %
EOS (ABSOLUTE): 0.2 10*3/uL (ref 0.0–0.4)
Eos: 3 %
Hematocrit: 40.1 % (ref 34.0–46.6)
Hemoglobin: 13.7 g/dL (ref 11.1–15.9)
Immature Grans (Abs): 0 10*3/uL (ref 0.0–0.1)
Immature Granulocytes: 0 %
Lymphocytes Absolute: 2 10*3/uL (ref 0.7–3.1)
Lymphs: 30 %
MCH: 30.2 pg (ref 26.6–33.0)
MCHC: 34.2 g/dL (ref 31.5–35.7)
MCV: 88 fL (ref 79–97)
Monocytes Absolute: 0.4 10*3/uL (ref 0.1–0.9)
Monocytes: 6 %
Neutrophils Absolute: 4.1 10*3/uL (ref 1.4–7.0)
Neutrophils: 60 %
Platelets: 362 10*3/uL (ref 150–450)
RBC: 4.54 x10E6/uL (ref 3.77–5.28)
RDW: 12.5 % (ref 11.7–15.4)
WBC: 6.9 10*3/uL (ref 3.4–10.8)

## 2020-04-02 LAB — COMPREHENSIVE METABOLIC PANEL
ALT: 14 IU/L (ref 0–32)
AST: 15 IU/L (ref 0–40)
Albumin/Globulin Ratio: 2.2 (ref 1.2–2.2)
Albumin: 4.9 g/dL — ABNORMAL HIGH (ref 3.8–4.8)
Alkaline Phosphatase: 61 IU/L (ref 44–121)
BUN/Creatinine Ratio: 17 (ref 9–23)
BUN: 11 mg/dL (ref 6–24)
Bilirubin Total: 0.7 mg/dL (ref 0.0–1.2)
CO2: 18 mmol/L — ABNORMAL LOW (ref 20–29)
Calcium: 9.6 mg/dL (ref 8.7–10.2)
Chloride: 103 mmol/L (ref 96–106)
Creatinine, Ser: 0.66 mg/dL (ref 0.57–1.00)
GFR calc Af Amer: 127 mL/min/{1.73_m2} (ref >59)
GFR calc non Af Amer: 110 mL/min/{1.73_m2} (ref >59)
Globulin, Total: 2.2 g/dL (ref 1.5–4.5)
Glucose: 120 mg/dL — ABNORMAL HIGH (ref 65–99)
Potassium: 3.9 mmol/L (ref 3.5–5.2)
Sodium: 141 mmol/L (ref 134–144)
Total Protein: 7.1 g/dL (ref 6.0–8.5)

## 2020-04-02 LAB — HEMOGLOBIN A1C
Est. average glucose Bld gHb Est-mCnc: 126 mg/dL
Hgb A1c MFr Bld: 6 % — ABNORMAL HIGH (ref 4.8–5.6)

## 2020-04-02 LAB — LIPID PANEL WITH LDL/HDL RATIO
Cholesterol, Total: 198 mg/dL (ref 100–199)
HDL: 54 mg/dL (ref >39)
LDL Chol Calc (NIH): 131 mg/dL — ABNORMAL HIGH (ref 0–99)
LDL/HDL Ratio: 2.4 ratio (ref 0.0–3.2)
Triglycerides: 71 mg/dL (ref 0–149)
VLDL Cholesterol Cal: 13 mg/dL (ref 5–40)

## 2020-04-02 LAB — HIV ANTIBODY (ROUTINE TESTING W REFLEX): HIV Screen 4th Generation wRfx: NONREACTIVE

## 2020-04-02 LAB — TSH: TSH: 2.03 u[IU]/mL (ref 0.450–4.500)

## 2020-04-02 LAB — HEPATITIS C ANTIBODY: Hep C Virus Ab: <0.1 s/co ratio (ref 0.0–0.9)

## 2020-04-16 ENCOUNTER — Encounter: Payer: Self-pay | Admitting: Physician Assistant

## 2020-05-18 ENCOUNTER — Other Ambulatory Visit: Payer: Self-pay | Admitting: Physician Assistant

## 2020-05-18 DIAGNOSIS — I10 Essential (primary) hypertension: Secondary | ICD-10-CM

## 2020-08-13 ENCOUNTER — Other Ambulatory Visit: Payer: Self-pay | Admitting: Physician Assistant

## 2020-08-13 DIAGNOSIS — I10 Essential (primary) hypertension: Secondary | ICD-10-CM

## 2020-08-14 ENCOUNTER — Other Ambulatory Visit: Payer: Self-pay | Admitting: Physician Assistant

## 2020-08-14 DIAGNOSIS — I10 Essential (primary) hypertension: Secondary | ICD-10-CM

## 2020-08-14 DIAGNOSIS — E282 Polycystic ovarian syndrome: Secondary | ICD-10-CM

## 2020-08-14 DIAGNOSIS — E119 Type 2 diabetes mellitus without complications: Secondary | ICD-10-CM

## 2020-11-17 ENCOUNTER — Other Ambulatory Visit: Payer: Self-pay | Admitting: Family Medicine

## 2020-11-17 DIAGNOSIS — E119 Type 2 diabetes mellitus without complications: Secondary | ICD-10-CM

## 2020-11-17 DIAGNOSIS — E282 Polycystic ovarian syndrome: Secondary | ICD-10-CM

## 2020-11-17 DIAGNOSIS — I10 Essential (primary) hypertension: Secondary | ICD-10-CM

## 2020-11-17 NOTE — Telephone Encounter (Signed)
Requested medications are due for refill today yes  Requested medications are on the active medication list yes  Last refill 08/18/20  Last visit 04/01/20  Future visit scheduled no  Notes to clinic Failed protocol due to no valid visit within 6  months, no upcoming visit scheduled, please assess.

## 2020-11-18 NOTE — Telephone Encounter (Signed)
Requested medications are due for refill today.  yes  Requested medications are on the active medications list.  yes  Last refill. 08/18/2020 for both  Future visit scheduled.   no  Notes to clinic.  PCP listed as Joycelyn Man.

## 2021-01-16 ENCOUNTER — Emergency Department
Admission: EM | Admit: 2021-01-16 | Discharge: 2021-01-16 | Disposition: A | Discharge status: Home / Self Care | Payer: BC Managed Care – PPO | Attending: Emergency Medicine | Admitting: Emergency Medicine

## 2021-01-16 ENCOUNTER — Other Ambulatory Visit: Payer: Self-pay

## 2021-01-16 ENCOUNTER — Encounter: Payer: Self-pay | Admitting: Emergency Medicine

## 2021-01-16 DIAGNOSIS — Z79899 Other long term (current) drug therapy: Secondary | ICD-10-CM | POA: Insufficient documentation

## 2021-01-16 DIAGNOSIS — Z7984 Long term (current) use of oral hypoglycemic drugs: Secondary | ICD-10-CM | POA: Diagnosis not present

## 2021-01-16 DIAGNOSIS — Y92410 Unspecified street and highway as the place of occurrence of the external cause: Secondary | ICD-10-CM | POA: Diagnosis not present

## 2021-01-16 DIAGNOSIS — E119 Type 2 diabetes mellitus without complications: Secondary | ICD-10-CM | POA: Insufficient documentation

## 2021-01-16 DIAGNOSIS — I10 Essential (primary) hypertension: Secondary | ICD-10-CM | POA: Insufficient documentation

## 2021-01-16 DIAGNOSIS — M545 Low back pain, unspecified: Secondary | ICD-10-CM | POA: Diagnosis present

## 2021-01-16 DIAGNOSIS — S39012A Strain of muscle, fascia and tendon of lower back, initial encounter: Secondary | ICD-10-CM | POA: Diagnosis not present
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
## 2021-02-24 ENCOUNTER — Other Ambulatory Visit: Payer: Self-pay | Admitting: Family Medicine

## 2021-02-24 DIAGNOSIS — I10 Essential (primary) hypertension: Secondary | ICD-10-CM

## 2021-02-24 NOTE — Telephone Encounter (Signed)
Requested medications are due for refill today.  yes  Requested medications are on the active medications list.  yes  Last refill. 11/18/2020  Future visit scheduled.   yes  Notes to clinic.  Pt is more than 3 months overdue for office visit.    Requested Prescriptions  Pending Prescriptions Disp Refills   triamterene-hydrochlorothiazide (MAXZIDE-25) 37.5-25 MG tablet [Pharmacy Med Name: TRIAMTERENE-HCTZ 37.5-25 MG TB] 90 tablet 0    Sig: TAKE 1 TABLET BY MOUTH EVERY DAY     Cardiovascular: Diuretic Combos Failed - 02/24/2021  1:23 AM      Failed - Last BP in normal range    BP Readings from Last 1 Encounters:  01/16/21 (!) 139/97          Failed - Valid encounter within last 6 months    Recent Outpatient Visits           10 months ago Annual physical exam   [facility] [place], [place], [place]   1 year ago Essential hypertension   [facility] [place], [place], [place]   1 year ago Annual physical exam   [facility] Joycelyn Man [place], [place]   2 years ago Type 2 diabetes mellitus without complication, unspecified whether long term insulin use [facility])   [facility] [place], [place], [place]   2 years ago Annual physical exam   [facility] [place], [place], [place]       Future Appointments             In 1 month Drubel, Lillia Abed, PA-C [employer]            Passed - K in normal range and within 360 days    Potassium  Date Value Ref Range Status  04/01/2020 3.9 3.5 - 5.2 mmol/L Final  04/17/2012 3.5 3.5 - 5.1 mmol/L Final          Passed - Na in normal range and within 360 days    Sodium  Date Value Ref Range Status  04/01/2020 141 134 - 144 mmol/L Final  04/17/2012 136 136 - 145 mmol/L Final          Passed - Cr in normal range and within 360 days    Creatinine  Date Value Ref Range Status  04/17/2012 0.78 0.60 - 1.30 mg/dL Final   Creatinine, Ser   Date Value Ref Range Status  04/01/2020 0.66 0.57 - 1.00 mg/dL Final    Comment:                   **Effective April 06, 2020 [facility] will begin**                  reporting the 2021 CKD-EPI creatinine equation that                  estimates kidney function without a race variable.           Passed - Ca in normal range and within 360 days    Calcium  Date Value Ref Range Status  04/01/2020 9.6 8.7 - 10.2 mg/dL Final   Calcium, Total  Date Value Ref Range Status  04/17/2012 8.6 8.5 - 10.1 mg/dL Final

## 2021-04-05 NOTE — Progress Notes (Signed)
I,[name],acting as a Education administrator for [employer], PA-C.,have documented all relevant documentation on the behalf of Tracey Kirschner, PA-C,as directed by  Tracey Kirschner, PA-C while in the presence of Tracey Kirschner, PA-C.  Complete physical exam   Patient: Tracey Kennedy   DOB: [birth year]   41 y.o. Female  MRN: [id number] Visit Date: 04/06/2021  Today's healthcare provider: [name], PA-C   Cc. cpe  Subjective    Tracey Kennedy is a 41 y.o. female who presents today for a complete physical exam.  She reports consuming a general diet. The patient does not participate in regular exercise at present. She generally feels well. She reports sleeping well. She does have additional problems to discuss today.   On her left lower leg she has noticed a new spot that has grown over the past few months Reports it is raised, itchy, scaly. Denies bleeding.   Past Medical History:  Diagnosis Date   Diabetes mellitus without complication ([place])    Hypertension    Past Surgical History:  Procedure Laterality Date   CHOLECYSTECTOMY     Social History   Socioeconomic History   Marital status: Married    Spouse name: Not on file   Number of children: Not on file   Years of education: Not on file   Highest education level: Not on file  Occupational History   Not on file  Tobacco Use   Smoking status: Never   Smokeless tobacco: Never  Vaping Use   Vaping Use: Never used  Substance and Sexual Activity   Alcohol use: No   Drug use: No   Sexual activity: Yes  Other Topics Concern   Not on file  Social History Narrative   Not on file   Social Determinants of Health   Financial Resource Strain: Not on file  Food Insecurity: Not on file  Transportation Needs: Not on file  Physical Activity: Not on file  Stress: Not on file  Social Connections: Not on file  Intimate Partner Violence: Not on file   Family Status  Relation Name Status   Mother   Deceased       2006/05/02 of multiple myeloma   Father  Alive   Sister  Alive   Brother  Alive   [place]  Deceased   PGF  Deceased   H Brother  Alive   H Brother  Alive   H Sister  Alive   Family History  Problem Relation Age of Onset   Multiple myeloma Mother    Diabetes Mother    Hypertension Father    Hyperlipidemia Father    Diabetes Father    Cirrhosis Father    Polycystic ovary syndrome Sister    Healthy Brother    Bone cancer Paternal Grandmother    Heart disease Paternal Grandfather    Stroke Paternal Grandfather    Healthy Half-Brother    Healthy Half-Brother    Healthy Half-Sister    Allergies  Allergen Reactions   Penicillins     Patient Care Team: Tracey Kirschner, PA-C as PCP - General (Physician Assistant) Paula Compton, MD as Consulting Physician (Obstetrics and Gynecology)   Medications: Outpatient Medications Prior to Visit  Medication Sig   CAMILA 0.35 MG tablet Take 1 tablet by mouth daily.   glucose blood (ONE TOUCH ULTRA TEST) test strip Check blood glucose readings ACHS   labetalol (NORMODYNE) 200 MG tablet TAKE 1 TABLET BY MOUTH TWICE A DAY   Lancets (ONETOUCH ULTRASOFT) lancets  Check blood sugar ACHS   metFORMIN (GLUCOPHAGE) 500 MG tablet TAKE 2 TABLETS BY MOUTH EVERY MORNING AND 1 TABLET BY MOUTH EVERY EVENING   [DISCONTINUED] triamterene-hydrochlorothiazide (MAXZIDE-25) 37.5-25 MG tablet TAKE 1 TABLET BY MOUTH EVERY DAY   No facility-administered medications prior to visit.    Review of Systems  Constitutional: Negative.  Negative for fatigue and fever.  Eyes: Negative.   Respiratory:  Negative for cough and shortness of breath.   Cardiovascular: Negative.  Negative for chest pain and leg swelling.  Gastrointestinal: Negative.  Negative for abdominal pain.  Endocrine: Negative.   Genitourinary: Negative.   Musculoskeletal: Negative.   Skin:  Positive for color change.  Allergic/Immunologic: Negative.   Neurological: Negative.  Negative  for dizziness and headaches.  Hematological: Negative.   Psychiatric/Behavioral: Negative.      Objective    Blood pressure (!) 131/94, pulse 79, height 5' 3"  (1.6 m), weight 157 lb 3.2 oz (71.3 kg), last menstrual period 03/23/2021, SpO2 100 %.  *has not taken BP meds yet today  Physical Exam Constitutional:      General: She is awake.     Appearance: She is well-developed.  HENT:     Head: Normocephalic.     Right Ear: Tympanic membrane normal.     Left Ear: Tympanic membrane normal.  Eyes:     Conjunctiva/sclera: Conjunctivae normal.     Pupils: Pupils are equal, round, and reactive to light.  Neck:     Thyroid: No thyroid mass or thyromegaly.  Cardiovascular:     Rate and Rhythm: Normal rate and regular rhythm.     Pulses:          Dorsalis pedis pulses are 3+ on the right side and 3+ on the left side.     Heart sounds: Normal heart sounds.  Pulmonary:     Effort: Pulmonary effort is normal.     Breath sounds: Normal breath sounds.  Abdominal:     Palpations: Abdomen is soft.     Tenderness: There is no abdominal tenderness.  Musculoskeletal:     Right lower leg: No swelling.     Left lower leg: No swelling.  Feet:     Right foot:     Protective Sensation: 3 sites tested.  3 sites sensed.     Skin integrity: Skin integrity normal.     Left foot:     Protective Sensation: 3 sites tested.  3 sites sensed.     Skin integrity: Skin integrity normal.  Lymphadenopathy:     Cervical: No cervical adenopathy.  Skin:    General: Skin is warm.     Comments: Left medial lower leg w/ a raised, round, textured lesion with a thin red border. Nonbleeding  Neurological:     Mental Status: She is alert and oriented to person, place, and time.  Psychiatric:        Attention and Perception: Attention normal.        Mood and Affect: Mood normal.        Speech: Speech normal.        Behavior: Behavior is cooperative.      Last depression screening scores PHQ 2/9 Scores  04/06/2021 04/01/2020 03/29/2019  PHQ - 2 Score 0 0 0  PHQ- 9 Score 1 1 -   Last fall risk screening Fall Risk  04/06/2021  Falls in the past year? 0  Number falls in past yr: 0  Injury with Fall? 0  Risk for fall due  to : No Fall Risks  Follow up -   Last Audit-C alcohol use screening Alcohol Use Disorder Test (AUDIT) 04/06/2021  1. How often do you have a drink containing alcohol? 2  2. How many drinks containing alcohol do you have on a typical day when you are drinking? 0  3. How often do you have six or more drinks on one occasion? 0  AUDIT-C Score 2  Alcohol Brief Interventions/Follow-up -   A score of 3 or more in women, and 4 or more in men indicates increased risk for alcohol abuse, EXCEPT if all of the points are from question 1   No results found for any visits on 04/06/21.  Assessment & Plan    Routine Health Maintenance and Physical Exam  Exercise Activities and Dietary recommendations --balanced diet low in sugars, fats, carbs high in protein, greens, fiber --regular physical activity/exercise, 3-5 times a week   Immunization History  Administered Date(s) Administered   Influenza,inj,Quad PF,6+ Mos 11/08/2018   Influenza,inj,Quad PF,6-35 Mos 11/12/2020   PFIZER(Purple Top)SARS-COV-2 Vaccination 04/07/2019, 04/30/2019, 02/18/2020   Td 04/06/2021    Health Maintenance  Topic Date Due   PAP SMEAR-Modifier  04/16/2017   URINE MICROALBUMIN  11/08/2019   HEMOGLOBIN A1C  09/29/2020   OPHTHALMOLOGY EXAM  04/01/2021   COVID-19 Vaccine (4 - Booster for [place] series) 04/22/2021 (Originally 04/14/2020)   FOOT EXAM  04/06/2022   TETANUS/TDAP  04/07/2031   INFLUENZA VACCINE  Completed   Hepatitis C Screening  Completed   HIV Screening  Completed   HPV VACCINES  Aged Out    Discussed health benefits of physical activity, and encouraged her to engage in regular exercise appropriate for her age and condition.  Problem List Items Addressed This Visit        Cardiovascular and Mediastinum   Essential hypertension    Has not taken meds yet today. Will continue to montior, but continue w/ maxide and labetalol. If poor control in future would prefer acei/arb, I do not see history of failure      Relevant Medications   triamterene-hydrochlorothiazide (MAXZIDE-25) 37.5-25 MG tablet     Endocrine   Diabetes ([place])    Will check a1c in bw.  Not maxed on metformin, if a1c considerably higher would first bring metformin to 1000 mg bid.        Relevant Orders   HgB A1c   Comprehensive Metabolic Panel (CMET)   CBC   Urine Microalbumin w/creat. ratio   Hyperlipidemia associated with type 2 diabetes mellitus (HCC)    Will check lipids, low risk score. The 10-year ASCVD risk score (Arnett DK, et al., 2019) is: 1.8%       Relevant Medications   triamterene-hydrochlorothiazide (MAXZIDE-25) 37.5-25 MG tablet   Other Relevant Orders   Lipid Panel With LDL/HDL Ratio   Comprehensive Metabolic Panel (CMET)   CBC     Musculoskeletal and Integument   Skin lesion of left leg    Ref to derm for possible biopsy.        Relevant Orders   Ambulatory referral to Dermatology   Other Visit Diagnoses     Encounter for health maintenance examination    -  Primary   Relevant Orders   HgB A1c   Lipid Panel With LDL/HDL Ratio   Comprehensive Metabolic Panel (CMET)   CBC   Need for Td vaccine       Relevant Medications   tetanus & diphtheria toxoids (adult) (TENIVAC) injection 0.5 mL (  Completed)        Return in about 6 months (around 10/04/2021) for hypertension, DMII-- may change depending on pended a1c.     I, Tracey Kirschner, PA-C have reviewed all documentation for this visit. The documentation on  04/06/2021 for the exam, diagnosis, procedures, and orders are all accurate and complete.  Tracey Kirschner, PA-C [facility] [address] [place], [place], [address] Office: [phone] Fax: [phone]

## 2021-04-06 ENCOUNTER — Encounter: Payer: Self-pay | Admitting: Physician Assistant

## 2021-04-06 ENCOUNTER — Ambulatory Visit (INDEPENDENT_AMBULATORY_CARE_PROVIDER_SITE_OTHER): Payer: BC Managed Care – PPO | Admitting: Physician Assistant

## 2021-04-06 ENCOUNTER — Other Ambulatory Visit: Payer: Self-pay

## 2021-04-06 VITALS — BP 131/94 | HR 79 | Ht 63.0 in | Wt 157.2 lb

## 2021-04-06 DIAGNOSIS — E785 Hyperlipidemia, unspecified: Secondary | ICD-10-CM

## 2021-04-06 DIAGNOSIS — Z0000 Encounter for general adult medical examination without abnormal findings: Secondary | ICD-10-CM

## 2021-04-06 DIAGNOSIS — E1159 Type 2 diabetes mellitus with other circulatory complications: Secondary | ICD-10-CM

## 2021-04-06 DIAGNOSIS — I152 Hypertension secondary to endocrine disorders: Secondary | ICD-10-CM

## 2021-04-06 DIAGNOSIS — Z23 Encounter for immunization: Secondary | ICD-10-CM

## 2021-04-06 DIAGNOSIS — E1169 Type 2 diabetes mellitus with other specified complication: Secondary | ICD-10-CM | POA: Diagnosis not present

## 2021-04-06 DIAGNOSIS — E119 Type 2 diabetes mellitus without complications: Secondary | ICD-10-CM | POA: Diagnosis not present

## 2021-04-06 DIAGNOSIS — L989 Disorder of the skin and subcutaneous tissue, unspecified: Secondary | ICD-10-CM | POA: Insufficient documentation

## 2021-04-06 LAB — HM MAMMOGRAPHY

## 2021-04-06 MED ORDER — TETANUS-DIPHTHERIA TOXOIDS TD 5-2 LFU IM INJ
0.5000 mL | INJECTION | Freq: Once | INTRAMUSCULAR | Status: AC
  Administered 2021-04-06: 0.5 mL via INTRAMUSCULAR

## 2021-04-06 MED ORDER — TRIAMTERENE-HCTZ 37.5-25 MG PO TABS: 1.0000 | ORAL_TABLET | Freq: Every day | ORAL | 1 refills | Status: DC

## 2021-04-06 NOTE — Assessment & Plan Note (Signed)
Will check lipids, low risk score. The 10-year ASCVD risk score (Arnett DK, et al., 2019) is: 1.8%

## 2021-04-06 NOTE — Assessment & Plan Note (Signed)
Ref to derm for possible biopsy.

## 2021-04-06 NOTE — Assessment & Plan Note (Signed)
Will check a1c in bw.  Not maxed on metformin, if a1c considerably higher would first bring metformin to 1000 mg bid.

## 2021-04-06 NOTE — Assessment & Plan Note (Signed)
Has not taken meds yet today. Will continue to montior, but continue w/ maxide and labetalol. If poor control in future would prefer acei/arb, I do not see history of failure

## 2021-04-07 LAB — COMPREHENSIVE METABOLIC PANEL
ALT: 14 IU/L (ref 0–32)
AST: 17 IU/L (ref 0–40)
Albumin/Globulin Ratio: 2 (ref 1.2–2.2)
Albumin: 4.7 g/dL (ref 3.8–4.8)
Alkaline Phosphatase: 57 IU/L (ref 44–121)
BUN/Creatinine Ratio: 15 (ref 9–23)
BUN: 11 mg/dL (ref 6–24)
Bilirubin Total: 0.8 mg/dL (ref 0.0–1.2)
CO2: 22 mmol/L (ref 20–29)
Calcium: 9.9 mg/dL (ref 8.7–10.2)
Chloride: 102 mmol/L (ref 96–106)
Creatinine, Ser: 0.72 mg/dL (ref 0.57–1.00)
Globulin, Total: 2.4 g/dL (ref 1.5–4.5)
Glucose: 108 mg/dL — ABNORMAL HIGH (ref 70–99)
Potassium: 4.2 mmol/L (ref 3.5–5.2)
Sodium: 140 mmol/L (ref 134–144)
Total Protein: 7.1 g/dL (ref 6.0–8.5)
eGFR: 107 mL/min/{1.73_m2} (ref >59)

## 2021-04-07 LAB — CBC
Hematocrit: 39.8 % (ref 34.0–46.6)
Hemoglobin: 13.5 g/dL (ref 11.1–15.9)
MCH: 29.7 pg (ref 26.6–33.0)
MCHC: 33.9 g/dL (ref 31.5–35.7)
MCV: 88 fL (ref 79–97)
Platelets: 376 10*3/uL (ref 150–450)
RBC: 4.55 x10E6/uL (ref 3.77–5.28)
RDW: 12.4 % (ref 11.7–15.4)
WBC: 8.8 10*3/uL (ref 3.4–10.8)

## 2021-04-07 LAB — MICROALBUMIN / CREATININE URINE RATIO
Creatinine, Urine: 145.2 mg/dL
Microalb/Creat Ratio: 5 mg/g creat (ref 0–29)
Microalbumin, Urine: 7.3 ug/mL

## 2021-04-07 LAB — LIPID PANEL WITH LDL/HDL RATIO
Cholesterol, Total: 195 mg/dL (ref 100–199)
HDL: 55 mg/dL (ref >39)
LDL Chol Calc (NIH): 126 mg/dL — ABNORMAL HIGH (ref 0–99)
LDL/HDL Ratio: 2.3 ratio (ref 0.0–3.2)
Triglycerides: 78 mg/dL (ref 0–149)
VLDL Cholesterol Cal: 14 mg/dL (ref 5–40)

## 2021-04-07 LAB — HEMOGLOBIN A1C
Est. average glucose Bld gHb Est-mCnc: 128 mg/dL
Hgb A1c MFr Bld: 6.1 % — ABNORMAL HIGH (ref 4.8–5.6)

## 2021-05-24 ENCOUNTER — Other Ambulatory Visit: Payer: Self-pay | Admitting: Family Medicine

## 2021-05-24 DIAGNOSIS — I10 Essential (primary) hypertension: Secondary | ICD-10-CM

## 2021-05-24 DIAGNOSIS — E119 Type 2 diabetes mellitus without complications: Secondary | ICD-10-CM

## 2021-05-24 DIAGNOSIS — E282 Polycystic ovarian syndrome: Secondary | ICD-10-CM

## 2021-05-25 NOTE — Telephone Encounter (Signed)
Requested Prescriptions  ?Pending Prescriptions Disp Refills  ?? labetalol (NORMODYNE) 200 MG tablet [Pharmacy Med Name: LABETALOL HCL 200 MG TABLET] 180 tablet 1  ?  Sig: TAKE 1 TABLET BY MOUTH TWICE A DAY  ?  ? Cardiovascular:  Beta Blockers Failed - 05/24/2021  9:28 PM  ?  ?  Failed - Last BP in normal range  ?  BP Readings from Last 1 Encounters:  ?04/06/21 (!) 131/94  ?   ?  ?  Passed - Last Heart Rate in normal range  ?  Pulse Readings from Last 1 Encounters:  ?04/06/21 79  ?   ?  ?  Passed - Valid encounter within last 6 months  ?  Recent Outpatient Visits   ?      ? 1 month ago Encounter for health maintenance examination  ? [facility] [name], PA-C  ? 1 year ago Annual physical exam  ? [facility] [place], [place]  ? 1 year ago Essential hypertension  ? [facility] [place], [place], [place]  ? 2 years ago Annual physical exam  ? [facility] [place], [place]  ? 2 years ago Type 2 diabetes mellitus without complication, unspecified whether long term insulin use ([place])  ? [place], [place]  ?  ?  ?Future Appointments   ?        ? In 4 months Drubel, Ria Comment, PA-C [facility], [facility]  ?  ? ?  ?  ?  ?? metFORMIN (GLUCOPHAGE) 500 MG tablet [Pharmacy Med Name: METFORMIN HCL 500 MG TABLET] 270 tablet 1  ?  Sig: TAKE 2 TABLETS BY MOUTH EVERY MORNING AND 1 TABLET BY MOUTH EVERY EVENING  ?  ? Endocrinology:  Diabetes - Biguanides Failed - 05/24/2021  9:28 PM  ?  ?  Failed - B12 Level in normal range and within 720 days  ?  No results found for: VITAMINB12   ?  ?  Failed - CBC within normal limits and completed in the last 12 months  ?  WBC  ?Date Value Ref Range Status  ?04/06/2021 8.8 3.4 - 10.8 x10E3/uL Final  ?04/17/2012 17.3 (H) 3.6 - 11.0 x10 3/mm 3 Final  ?07/10/2010 10.9 (H) 4.0 - 10.5 K/uL Final  ? ?RBC  ?Date Value Ref Range Status  ?04/06/2021 4.55 3.77 - 5.28 x10E6/uL Final   ?04/17/2012 4.72 3.80 - 5.20 X10 6/mm 3 Final  ?07/10/2010 3.43 (L) 3.87 - 5.11 MIL/uL Final  ? ?Hemoglobin  ?Date Value Ref Range Status  ?04/06/2021 13.5 11.1 - 15.9 g/dL Final  ? ?Hematocrit  ?Date Value Ref Range Status  ?04/06/2021 39.8 34.0 - 46.6 % Final  ? ?MCHC  ?Date Value Ref Range Status  ?04/06/2021 33.9 31.5 - 35.7 g/dL Final  ?04/17/2012 34.4 32.0 - 36.0 g/dL Final  ?07/10/2010 34.0 30.0 - 36.0 g/dL Final  ? ?MCH  ?Date Value Ref Range Status  ?04/06/2021 29.7 26.6 - 33.0 pg Final  ?04/17/2012 29.3 26.0 - 34.0 pg Final  ?07/10/2010 31.5 26.0 - 34.0 pg Final  ? ?MCV  ?Date Value Ref Range Status  ?04/06/2021 88 79 - 97 fL Final  ?04/17/2012 85 80 - 100 fL Final  ? ?No results found for: PLTCOUNTKUC, LABPLAT, [place] ?RDW  ?Date Value Ref Range Status  ?04/06/2021 12.4 11.7 - 15.4 % Final  ?04/17/2012 13.2 11.5 - 14.5 % Final  ? ?  ?  ?  Passed -  Cr in normal range and within 360 days  ?  Creatinine  ?Date Value Ref Range Status  ?04/17/2012 0.78 0.60 - 1.30 mg/dL Final  ? ?Creatinine, Ser  ?Date Value Ref Range Status  ?04/06/2021 0.72 0.57 - 1.00 mg/dL Final  ?   ?  ?  Passed - HBA1C is between 0 and 7.9 and within 180 days  ?  Hgb A1c MFr Bld  ?Date Value Ref Range Status  ?04/06/2021 6.1 (H) 4.8 - 5.6 % Final  ?  Comment:  ?           Prediabetes: 5.7 - 6.4 ?         Diabetes: >6.4 ?         Glycemic control for adults with diabetes: <7.0 ?  ?   ?  ?  Passed - eGFR in normal range and within 360 days  ?  EGFR (African American)  ?Date Value Ref Range Status  ?04/17/2012 >60  Final  ? ?GFR calc Af Wyvonnia Lora  ?Date Value Ref Range Status  ?04/01/2020 127 >59 mL/min/1.73 Final  ?  Comment:  ?  **In accordance with recommendations from the NKF-ASN Task force,** ?  [facility] is in the process of updating its eGFR calculation to the ?  2021 CKD-EPI creatinine equation that estimates kidney function ?  without a race variable. ?  ? ?EGFR (Non-African Amer.)  ?Date Value Ref Range Status  ?04/17/2012 >60  Final  ?   Comment:  ?  eGFR values <44m/min/1.73 m2 may be an indication of chronic ?kidney disease (CKD). ?Calculated eGFR is useful in patients with stable renal function. ?The eGFR calculation will not be reliable in acutely ill patients ?when serum creatinine is changing rapidly. It is not useful in  ?patients on dialysis. The eGFR calculation may not be applicable ?to patients at the low and high extremes of body sizes, pregnant ?women, and vegetarians. ?  ? ?GFR calc non Af Amer  ?Date Value Ref Range Status  ?04/01/2020 110 >59 mL/min/1.73 Final  ? ?eGFR  ?Date Value Ref Range Status  ?04/06/2021 107 >59 mL/min/1.73 Final  ?   ?  ?  Passed - Valid encounter within last 6 months  ?  Recent Outpatient Visits   ?      ? 1 month ago [facility] health maintenance examination  [facility]  ? [facility] [place] ? 1 year ago Essential hypertension  [facility] [place] ? 2 years ago Annual physical exam  ? [facility] [place] ? 2 years ago Type 2 diabetes mellitus without complication, unspecified whether long term insulin use ([place]  ? [place] ?  ?  ?Future Appointments   ?        ? In 4 months Drubel[facility]  ?  ? ?  ?  ?  ? ?

## 2021-08-19 LAB — HM DIABETES EYE EXAM

## 2021-10-10 ENCOUNTER — Other Ambulatory Visit: Payer: Self-pay | Admitting: Physician Assistant

## 2021-10-10 DIAGNOSIS — I152 Hypertension secondary to endocrine disorders: Secondary | ICD-10-CM

## 2021-10-15 ENCOUNTER — Encounter: Payer: Self-pay | Admitting: Physician Assistant

## 2021-10-15 ENCOUNTER — Ambulatory Visit: Payer: BC Managed Care – PPO | Admitting: Physician Assistant

## 2021-10-15 VITALS — BP 134/96 | HR 83 | Ht 63.0 in | Wt 162.1 lb

## 2021-10-15 DIAGNOSIS — Z23 Encounter for immunization: Secondary | ICD-10-CM

## 2021-10-15 DIAGNOSIS — E119 Type 2 diabetes mellitus without complications: Secondary | ICD-10-CM | POA: Diagnosis not present

## 2021-10-15 DIAGNOSIS — E1159 Type 2 diabetes mellitus with other circulatory complications: Secondary | ICD-10-CM | POA: Diagnosis not present

## 2021-10-15 DIAGNOSIS — E1169 Type 2 diabetes mellitus with other specified complication: Secondary | ICD-10-CM | POA: Diagnosis not present

## 2021-10-15 DIAGNOSIS — E785 Hyperlipidemia, unspecified: Secondary | ICD-10-CM

## 2021-10-15 DIAGNOSIS — I152 Hypertension secondary to endocrine disorders: Secondary | ICD-10-CM

## 2021-10-15 LAB — POCT GLYCOSYLATED HEMOGLOBIN (HGB A1C): Hemoglobin A1C: 6 % — AB (ref 4.0–5.6)

## 2021-10-15 MED ORDER — LOSARTAN POTASSIUM 25 MG PO TABS: 25.0000 mg | ORAL_TABLET | Freq: Every day | ORAL | 1 refills | Status: DC

## 2021-10-15 NOTE — Assessment & Plan Note (Addendum)
LDL goal is < 70 but will hold off on statins unless significant increase or mid 40s The 10-year ASCVD risk score (Arnett DK, et al., 2019) is: 1.8% Mutual discussion w/ pt to check annually

## 2021-10-15 NOTE — Assessment & Plan Note (Addendum)
Manages with labetalol 200 mg , maxide . Pt is not pregnant/breastfeeding and has no plans for more children D/t hx of DM would like to switch to ace/arb -- switch labetalol to losartan 25, may need higher dose, advised pt if bp is very high over next 2 weeks to call/message office F/u 4 weeks

## 2021-10-15 NOTE — Assessment & Plan Note (Addendum)
Manages with metformin 500 mg Well controlled A1c stable a 6.0% Not on statin as she is in her 62s, may consider in future.  Uacr, foot exam utd, optho utd F/u 6 mo

## 2021-10-15 NOTE — Progress Notes (Signed)
I,Tracey Kennedy,acting as a Neurosurgeon for [employer], PA-C.,have documented all relevant documentation on the behalf of Tracey Ferguson, PA-C,as directed by  Tracey Ferguson, PA-C while in the presence of Tracey Ferguson, PA-C.   Established patient visit   Patient: Tracey Kennedy   DOB: 08-21-1980   41 y.o. Female  MRN: [id number] Visit Date: 10/15/2021  Today's healthcare provider: [name], PA-C   Cc. DMII, htn   Subjective    HPI  Diabetes Mellitus Type II, follow-up  Lab Results  Component Value Date   HGBA1C 6.0 (A) 10/15/2021   HGBA1C 6.1 (H) 04/06/2021   HGBA1C 6.0 (H) 04/01/2020   Last seen for diabetes 6 months ago.  Management since then includes; increased metformin to 1000 mg bid.   Home blood sugar records: fasting range: 120-130's when checked and checked on occassions Most Recent Eye Exam: 08/19/2021  --------------------------------------------------------------------------------------------------- Hypertension, follow-up  BP Readings from Last 3 Encounters:  10/15/21 (!) 134/96  04/06/21 (!) 131/94  01/16/21 (!) 139/97   Wt Readings from Last 3 Encounters:  10/15/21 162 lb 1.6 oz (73.5 kg)  04/06/21 157 lb 3.2 oz (71.3 kg)  01/16/21 157 lb (71.2 kg)     She was last seen for hypertension 6 months ago.  Management since that visit includes; Will continue to montior, but continue w/ maxide and labetalol..  Outside blood pressures are checked on ocassions  ---------------------------------------------------------------------------------------------------   Medications: Outpatient Medications Prior to Visit  Medication Sig   CAMILA 0.35 MG tablet Take 1 tablet by mouth daily.   glucose blood (ONE TOUCH ULTRA TEST) test strip Check blood glucose readings ACHS   labetalol (NORMODYNE) 200 MG tablet TAKE 1 TABLET BY MOUTH TWICE A DAY   Lancets (ONETOUCH ULTRASOFT) lancets Check blood sugar ACHS   metFORMIN (GLUCOPHAGE) 500 MG  tablet TAKE 2 TABLETS BY MOUTH EVERY MORNING AND 1 TABLET BY MOUTH EVERY EVENING   triamterene-hydrochlorothiazide (MAXZIDE-25) 37.5-25 MG tablet TAKE 1 TABLET BY MOUTH EVERY DAY   No facility-administered medications prior to visit.    Review of Systems  Constitutional:  Negative for fatigue and fever.  Respiratory:  Negative for cough and shortness of breath.   Cardiovascular:  Negative for chest pain and leg swelling.  Gastrointestinal:  Negative for abdominal pain.  Neurological:  Negative for dizziness and headaches.       Objective    Blood pressure (!) 134/96, pulse 83, height 5\' 3"  (1.6 m), weight 162 lb 1.6 oz (73.5 kg), SpO2 100 %.   Physical Exam Constitutional:      General: She is awake.     Appearance: She is well-developed.  HENT:     Head: Normocephalic.  Eyes:     Conjunctiva/sclera: Conjunctivae normal.  Cardiovascular:     Rate and Rhythm: Normal rate and regular rhythm.     Heart sounds: Normal heart sounds.  Pulmonary:     Effort: Pulmonary effort is normal.     Breath sounds: Normal breath sounds.  Skin:    General: Skin is warm.  Neurological:     Mental Status: She is alert and oriented to person, place, and time.  Psychiatric:        Attention and Perception: Attention normal.        Mood and Affect: Mood normal.        Speech: Speech normal.        Behavior: Behavior is cooperative.      Results for orders placed or performed  in visit on 10/15/21  POCT glycosylated hemoglobin (Hb A1C)  Result Value Ref Range   Hemoglobin A1C 6.0 (A) 4.0 - 5.6 %   HbA1c POC (<> result, manual entry)     HbA1c, POC (prediabetic range)     HbA1c, POC (controlled diabetic range)      Assessment & Plan     Problem List Items Addressed This Visit       Cardiovascular and Mediastinum   Hypertension associated with diabetes (HCC)    Manages with labetalol 200 mg , maxide . Pt is not pregnant/breastfeeding and has no plans for more children D/t hx of DM  would like to switch to ace/arb -- switch labetalol to losartan 25, may need higher dose, advised pt if bp is very high over next 2 weeks to call/message office F/u 4 weeks       Relevant Medications   losartan (COZAAR) 25 MG tablet     Endocrine   Diabetes (HCC) - Primary    Manages with metformin 500 mg Well controlled A1c stable a 6.0% Not on statin as she is in her 33s, may consider in future.  Uacr, foot exam utd, optho utd F/u 6 mo       Relevant Medications   losartan (COZAAR) 25 MG tablet   Other Relevant Orders   POCT glycosylated hemoglobin (Hb A1C) (Completed)   Hyperlipidemia associated with type 2 diabetes mellitus (HCC)    LDL goal is < 70 but will hold off on statins unless significant increase or mid 40s The 10-year ASCVD risk score (Arnett DK, et al., 2019) is: 1.8% Mutual discussion w/ pt to check annually      Relevant Medications   losartan (COZAAR) 25 MG tablet   Other Visit Diagnoses     Influenza vaccine needed       Relevant Orders   Flu Vaccine QUAD 6+ mos PF IM (Fluarix Quad PF) (Completed)       Return in about 4 weeks (around 11/12/2021) for hypertension.      I, Tracey Ferguson, PA-C have reviewed all documentation for this visit. The documentation on  10/15/2021 for the exam, diagnosis, procedures, and orders are all accurate and complete.  Tracey Ferguson, PA-C [facility] [address] [place], [place], [address] Office: [phone] Fax: [phone]   [facility]

## 2021-11-12 ENCOUNTER — Ambulatory Visit: Payer: BC Managed Care – PPO | Admitting: Physician Assistant

## 2021-11-12 ENCOUNTER — Encounter: Payer: Self-pay | Admitting: Physician Assistant

## 2021-11-12 VITALS — BP 117/82 | HR 105 | Wt 159.4 lb

## 2021-11-12 DIAGNOSIS — R000 Tachycardia, unspecified: Secondary | ICD-10-CM

## 2021-11-12 DIAGNOSIS — I152 Hypertension secondary to endocrine disorders: Secondary | ICD-10-CM | POA: Diagnosis not present

## 2021-11-12 DIAGNOSIS — E1159 Type 2 diabetes mellitus with other circulatory complications: Secondary | ICD-10-CM

## 2021-11-12 NOTE — Assessment & Plan Note (Signed)
Labetalol may have been masking either sinus tach or runs of SVT Ordered zio monitor x 3 days will review results May need to swap losartan for metoprolol

## 2021-11-12 NOTE — Progress Notes (Signed)
I,Tracey Kennedy,acting as a Education administrator for [employer], PA-C.,have documented all relevant documentation on the behalf of Tracey Kirschner, PA-C,as directed by  Tracey Kirschner, PA-C while in the presence of Tracey Kirschner, PA-C.   Established patient visit   Patient: Tracey Kennedy   DOB: [birth year]   41 y.o. Female  MRN: [id number] Visit Date: 11/12/2021  Today's healthcare provider: [name], PA-C  Cc. Htn f/u  Subjective    HPI   Pt reports since switching medications her heart rate is up throughout the day-- walking around her watch shows 120-130s. Sometimes she will feel her heart pounding in her chest. Denies chest pain, dizziness. Denies this ever happening before.  Hypertension, follow-up  BP Readings from Last 3 Encounters:  11/12/21 117/82  10/15/21 (!) 134/96  04/06/21 (!) 131/94   Wt Readings from Last 3 Encounters:  11/12/21 159 lb 6.4 oz (72.3 kg)  10/15/21 162 lb 1.6 oz (73.5 kg)  04/06/21 157 lb 3.2 oz (71.3 kg)     She was last seen for hypertension 4 weeks ago.  BP at that visit was 134/96. Management since that visit includes switch labetalol to losartan 25.  She reports excellent compliance with treatment. She is not having side effects.  She is following a Low Sodium diet. She is not exercising. She does not smoke.  Use of agents associated with hypertension: none.   Outside blood pressures are checked on occassions; 117/82 last night Symptoms: No chest pain No chest pressure  Yes palpitations No syncope  No dyspnea No orthopnea  No paroxysmal nocturnal dyspnea No lower extremity edema   Pertinent labs Lab Results  Component Value Date   CHOL 195 04/06/2021   HDL 55 04/06/2021   LDLCALC 126 (H) 04/06/2021   TRIG 78 04/06/2021   CHOLHDL 4.3 04/01/2019   Lab Results  Component Value Date   NA 140 04/06/2021   K 4.2 04/06/2021   CREATININE 0.72 04/06/2021   EGFR 107 04/06/2021   GLUCOSE 108 (H) 04/06/2021   TSH 2.030  04/01/2020     The 10-year ASCVD risk score (Arnett DK, et al., 2019) is: 1.5%  ---------------------------------------------------------------------------------------------------   Medications: Outpatient Medications Prior to Visit  Medication Sig   CAMILA 0.35 MG tablet Take 1 tablet by mouth daily.   glucose blood (ONE TOUCH ULTRA TEST) test strip Check blood glucose readings ACHS   Lancets (ONETOUCH ULTRASOFT) lancets Check blood sugar ACHS   losartan (COZAAR) 25 MG tablet Take 1 tablet (25 mg total) by mouth daily.   metFORMIN (GLUCOPHAGE) 500 MG tablet TAKE 2 TABLETS BY MOUTH EVERY MORNING AND 1 TABLET BY MOUTH EVERY EVENING   triamterene-hydrochlorothiazide (MAXZIDE-25) 37.5-25 MG tablet TAKE 1 TABLET BY MOUTH EVERY DAY   labetalol (NORMODYNE) 200 MG tablet TAKE 1 TABLET BY MOUTH TWICE A DAY (Patient not taking: Reported on 11/12/2021)   No facility-administered medications prior to visit.    Review of Systems  Constitutional:  Negative for fatigue and fever.  Respiratory:  Negative for cough and shortness of breath.   Cardiovascular:  Negative for chest pain and leg swelling.       Fast heart beat, heart pounding  Gastrointestinal:  Negative for abdominal pain.  Neurological:  Negative for dizziness and headaches.      Objective    Blood pressure 117/82, pulse (!) 105, weight 159 lb 6.4 oz (72.3 kg), SpO2 100 %.   Physical Exam Constitutional:      General: She is awake.  Appearance: She is well-developed.  HENT:     Head: Normocephalic.  Eyes:     Conjunctiva/sclera: Conjunctivae normal.  Cardiovascular:     Rate and Rhythm: Normal rate and regular rhythm.     Heart sounds: Normal heart sounds.  Pulmonary:     Effort: Pulmonary effort is normal.     Breath sounds: Normal breath sounds.  Skin:    General: Skin is warm.  Neurological:     Mental Status: She is alert and oriented to person, place, and time.  Psychiatric:        Attention and Perception:  Attention normal.        Mood and Affect: Mood normal.        Speech: Speech normal.        Behavior: Behavior is cooperative.      No results found for any visits on 11/12/21.  Assessment & Plan     Problem List Items Addressed This Visit       Cardiovascular and Mediastinum   Hypertension associated with diabetes ([place]) - Primary    Losartan 25 mg  Elevated in office but within normal range at home  See tachycardia note        Other   Tachycardia    Labetalol may have been masking either sinus tach or runs of SVT Ordered zio monitor x 3 days will review results May need to swap losartan for metoprolol       Relevant Orders   LONG TERM MONITOR (3-14 DAYS)     Return in about 6 months (around 05/14/2022), or will contact pt with results of testing and recommendations, for hypertension.      I, Tracey Kirschner, PA-C have reviewed all documentation for this visit. The documentation on    for 11/12/2021 the exam, diagnosis, procedures, and orders are all accurate and complete.  Tracey Kirschner, PA-C [facility] [address] [place], [place], [address] Office: [phone] Fax: [place]

## 2021-11-12 NOTE — Assessment & Plan Note (Addendum)
Losartan 25 mg  Elevated in office but within normal range at home  See tachycardia note

## 2021-11-23 ENCOUNTER — Other Ambulatory Visit: Payer: Self-pay | Admitting: Physician Assistant

## 2021-11-23 DIAGNOSIS — E119 Type 2 diabetes mellitus without complications: Secondary | ICD-10-CM

## 2021-11-23 DIAGNOSIS — E282 Polycystic ovarian syndrome: Secondary | ICD-10-CM

## 2021-11-25 ENCOUNTER — Encounter: Payer: Self-pay | Admitting: Physician Assistant

## 2021-11-26 ENCOUNTER — Ambulatory Visit: Payer: BC Managed Care – PPO | Attending: Physician Assistant

## 2021-11-26 ENCOUNTER — Other Ambulatory Visit: Payer: Self-pay | Admitting: Physician Assistant

## 2021-11-26 DIAGNOSIS — R000 Tachycardia, unspecified: Secondary | ICD-10-CM

## 2021-11-30 DIAGNOSIS — R000 Tachycardia, unspecified: Secondary | ICD-10-CM | POA: Diagnosis not present

## 2021-12-05 ENCOUNTER — Other Ambulatory Visit: Payer: Self-pay | Admitting: Physician Assistant

## 2021-12-05 DIAGNOSIS — I10 Essential (primary) hypertension: Secondary | ICD-10-CM

## 2022-01-04 ENCOUNTER — Encounter: Payer: Self-pay | Admitting: Physician Assistant

## 2022-01-05 ENCOUNTER — Ambulatory Visit: Payer: Self-pay

## 2022-01-05 NOTE — Telephone Encounter (Signed)
  Chief Complaint: cough Symptoms: cough, wheezing and SOB at times  Frequency: started back Sunday but pt had bronchitis at the beginning of the month  Pertinent Negatives:NA Disposition: [] ED /[] Urgent Care (no appt availability in office) / [x] Appointment(In office/virtual)/ []  [facility]/ [] Home Care/ [] Refused Recommended Disposition /[]  Mobile Bus/ []  Follow-up with PCP Additional Notes: pt went to UC and was dx with bronchitis, said she got better for about 2 weeks and then sx came back Sunday. Sneezing and head congestion went away but pt still having cough that is severe at times. Pt scheduled appt for tomorrow. Care advice given and pt verbalized understanding.   Reason for Disposition  [1] Continuous (nonstop) coughing interferes with work or school AND [2] no improvement using cough treatment per Care Advice  Answer Assessment - Initial Assessment Questions 1. ONSET: "When did the cough begin?"      Sunday been going on since beginning of month  2. SEVERITY: "How bad is the cough today?"      Severe  3. SPUTUM: "Describe the color of your sputum" (none, dry cough; clear, white, yellow, green)     At times  5. DIFFICULTY BREATHING: "Are you having difficulty breathing?" If Yes, ask: "How bad is it?" (e.g., mild, moderate, severe)    - MILD: No SOB at rest, mild SOB with walking, speaks normally in sentences, can lie down, no retractions, pulse < 100.    - MODERATE: SOB at rest, SOB with minimal exertion and prefers to sit, cannot lie down flat, speaks in phrases, mild retractions, audible wheezing, pulse 100-120.    - SEVERE: Very SOB at rest, speaks in single words, struggling to breathe, sitting hunched forward, retractions, pulse > 120      Wheezing at times  6. FEVER: "Do you have a fever?" If Yes, ask: "What is your temperature, how was it measured, and when did it start?"     no 10. OTHER SYMPTOMS: "Do you have any other symptoms?" (e.g., runny nose,  wheezing, chest pain)  Protocols used: Cough - Acute Productive-A-AH

## 2022-01-06 ENCOUNTER — Encounter: Payer: Self-pay | Admitting: Physician Assistant

## 2022-01-06 ENCOUNTER — Ambulatory Visit
Admission: RE | Admit: 2022-01-06 | Discharge: 2022-01-06 | Disposition: A | Discharge status: Home / Self Care | Payer: BC Managed Care – PPO | Source: Ambulatory Visit | Attending: Physician Assistant | Admitting: Physician Assistant

## 2022-01-06 ENCOUNTER — Ambulatory Visit: Payer: BC Managed Care – PPO | Admitting: Physician Assistant

## 2022-01-06 ENCOUNTER — Ambulatory Visit
Admission: RE | Admit: 2022-01-06 | Discharge: 2022-01-06 | Disposition: A | Discharge status: Home / Self Care | Payer: BC Managed Care – PPO | Attending: Physician Assistant | Admitting: Physician Assistant

## 2022-01-06 VITALS — BP 125/91 | HR 111 | Temp 98.1°F | Wt 156.6 lb

## 2022-01-06 DIAGNOSIS — R051 Acute cough: Secondary | ICD-10-CM

## 2022-01-06 DIAGNOSIS — J209 Acute bronchitis, unspecified: Secondary | ICD-10-CM

## 2022-01-06 MED ORDER — PREDNISONE 20 MG PO TABS: 20.0000 mg | ORAL_TABLET | Freq: Every day | ORAL | 0 refills | Status: DC

## 2022-01-06 NOTE — Progress Notes (Signed)
Established patient visit   Patient: Tracey Kennedy   DOB: 02-13-1980   41 y.o. Female  MRN: [id number] Visit Date: 01/06/2022  Today's healthcare provider: [name], PA-C   Cc. Cough, chest congestion x 8 days  Subjective    HPI   Pt reports cough, congestions x 3 weeks was treated at urgent care and given zpack, albuterol inhaler, and tessalon pearles. Reports symptoms resolved but restarted x 8 days with cough, chest congestion , fatigue,. Reports getting short of breath when talking or after working all day. denies fever.   Outpatient Medications Prior to Visit  Medication Sig   CAMILA 0.35 MG tablet Take 1 tablet by mouth daily.   glucose blood (ONE TOUCH ULTRA TEST) test strip Check blood glucose readings ACHS   Lancets (ONETOUCH ULTRASOFT) lancets Check blood sugar ACHS   losartan (COZAAR) 25 MG tablet Take 1 tablet (25 mg total) by mouth daily.   metFORMIN (GLUCOPHAGE) 500 MG tablet TAKE 2 TABLETS BY MOUTH EVERY MORNING AND 1 TABLET BY MOUTH EVERY EVENING   triamterene-hydrochlorothiazide (MAXZIDE-25) 37.5-25 MG tablet TAKE 1 TABLET BY MOUTH EVERY DAY   [DISCONTINUED] labetalol (NORMODYNE) 200 MG tablet TAKE 1 TABLET BY MOUTH TWICE A DAY (Patient not taking: Reported on 11/12/2021)   No facility-administered medications prior to visit.    Review of Systems  Constitutional:  Positive for fatigue. Negative for fever.  HENT:  Positive for congestion.   Respiratory:  Positive for cough. Negative for shortness of breath.   Cardiovascular:  Negative for chest pain and leg swelling.  Gastrointestinal:  Negative for abdominal pain.  Neurological:  Negative for dizziness and headaches.       Objective    Blood pressure (!) 125/91, pulse (!) 111, temperature 98.1 F (36.7 C), temperature source Oral, weight 156 lb 9.6 oz (71 kg), last menstrual period 01/06/2022, SpO2 99 %.   Physical Exam Constitutional:      General: She is awake.     Appearance:  She is well-developed.  HENT:     Head: Normocephalic.  Eyes:     Conjunctiva/sclera: Conjunctivae normal.  Cardiovascular:     Rate and Rhythm: Normal rate and regular rhythm.     Heart sounds: Normal heart sounds.  Pulmonary:     Effort: Pulmonary effort is normal.     Breath sounds: Decreased breath sounds present. No wheezing.  Skin:    General: Skin is warm.  Neurological:     Mental Status: She is alert and oriented to person, place, and time.  Psychiatric:        Attention and Perception: Attention normal.        Mood and Affect: Mood normal.        Speech: Speech normal.        Behavior: Behavior is cooperative.     No results found for any visits on 01/06/22.  Assessment & Plan     Acute bronchitis Given prolonged natured of sickness ordered chest xray Chest xray negative for pneumonia Rx prednisone 20 mg x 5 days Recommended antihistamines, increase fluids, mucinex.  Advised to call if no improvement next 4-5 days  Return if symptoms worsen or fail to improve.     I, [name], PA-C have reviewed all documentation for this visit. The documentation on  01/06/2022  for the exam, diagnosis, procedures, and orders are all accurate and complete.  [name], PA-C [facility] [address] [place], [place], [address] Office:  [phone] Fax: [place]

## 2022-01-10 ENCOUNTER — Other Ambulatory Visit: Payer: Self-pay | Admitting: Physician Assistant

## 2022-01-10 DIAGNOSIS — E1159 Type 2 diabetes mellitus with other circulatory complications: Secondary | ICD-10-CM

## 2022-02-22 IMAGING — MG MM DIGITAL DIAGNOSTIC UNILAT*R* W/ TOMO W/ CAD
4 series · 4 of 12 positions shown · non-contrast
Comparison: Previous exam(s).

CLINICAL DATA: 38-year-old female presenting for six-month
follow-up of a likely benign right breast mass.

EXAM:
DIGITAL DIAGNOSTIC UNILATERAL RIGHT MAMMOGRAM WITH CAD AND TOMO
RIGHT BREAST ULTRASOUND

[R MLO synth-2D]
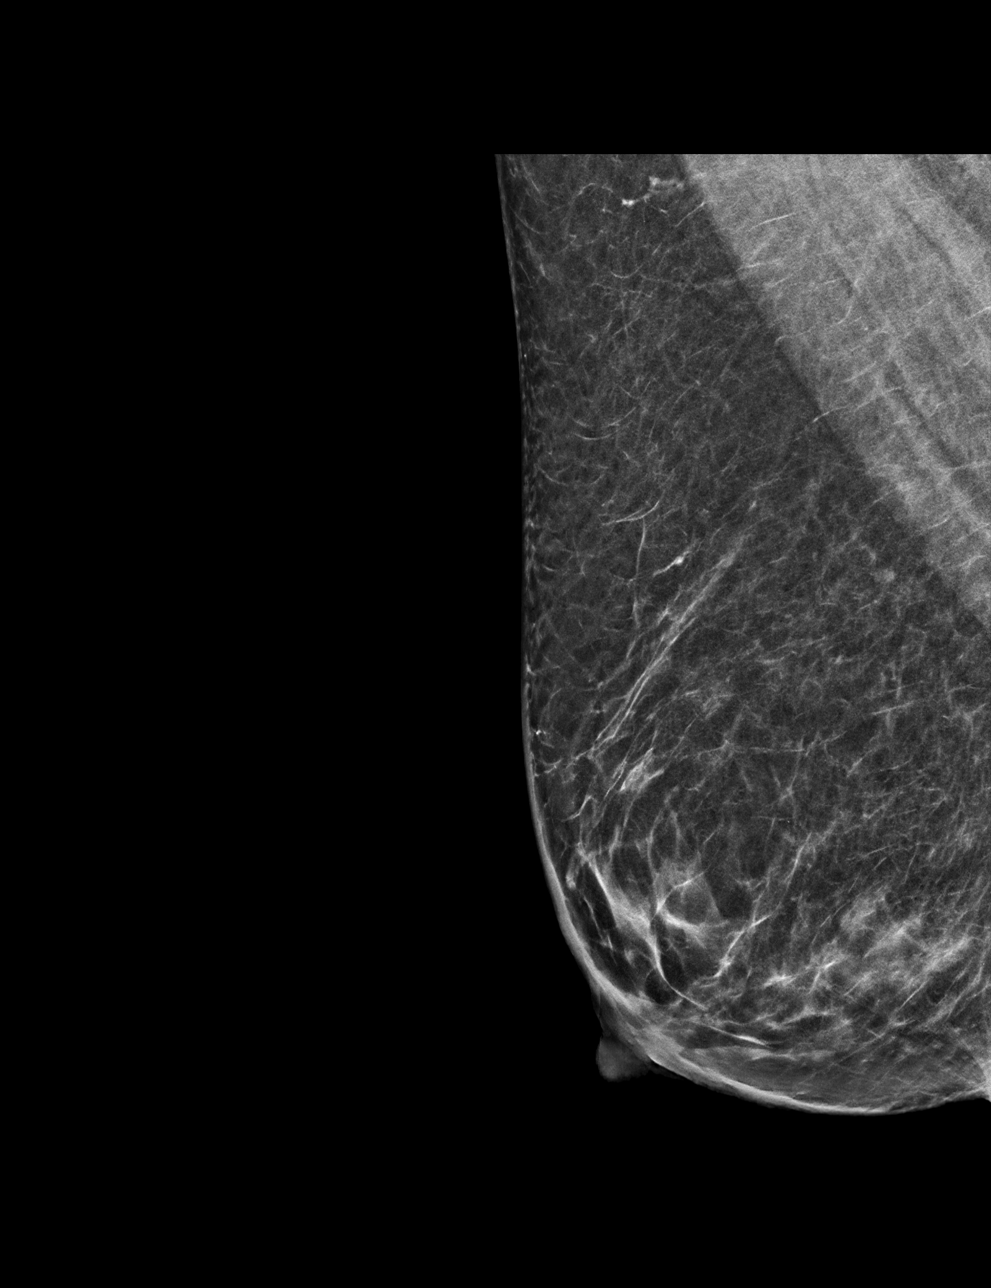

[R CC synth-2D]
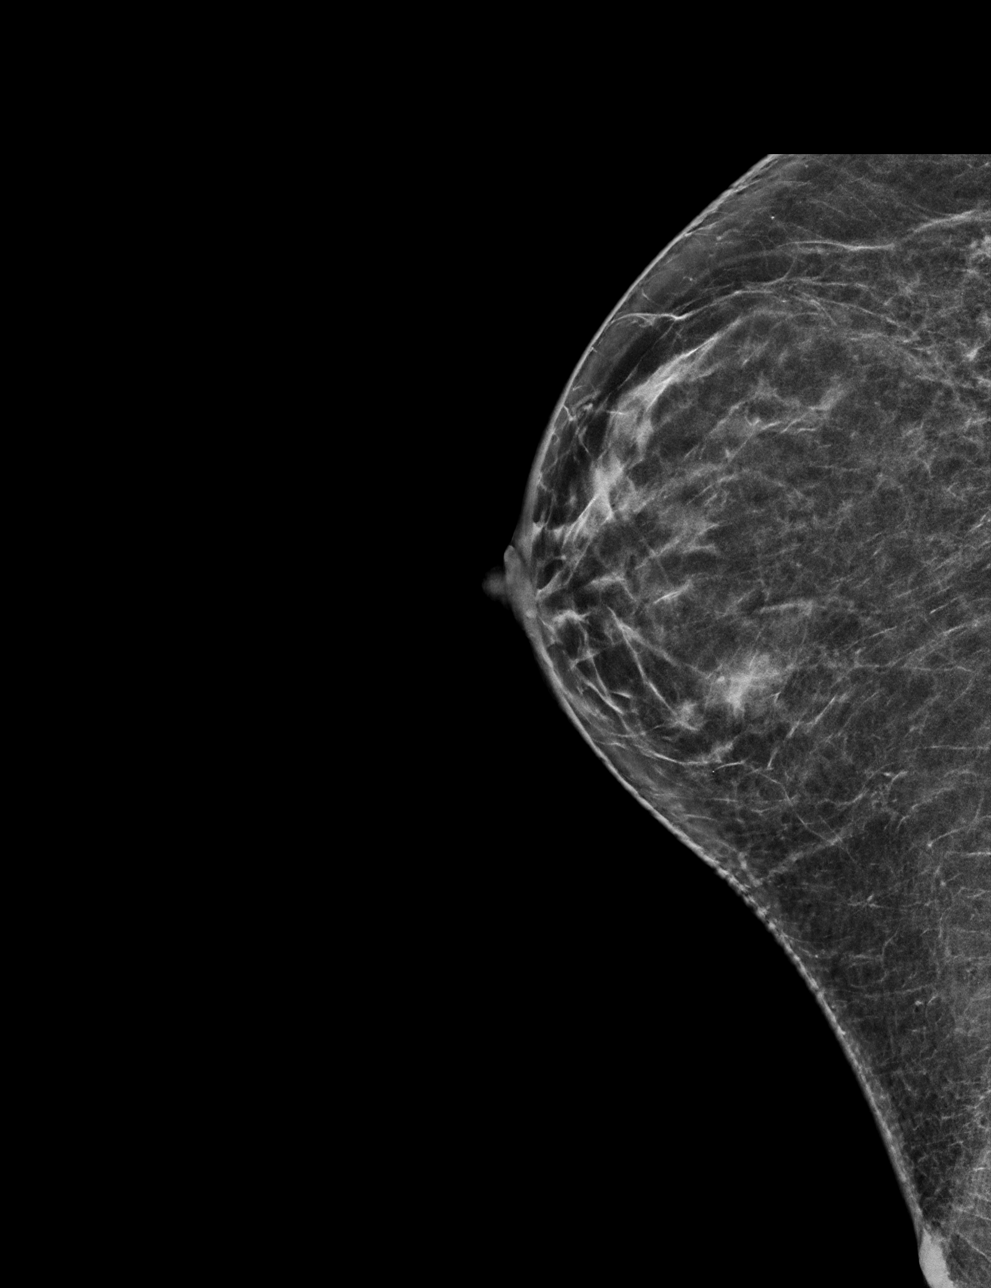

[R MLO tomo · tomo slice 31/61.0]
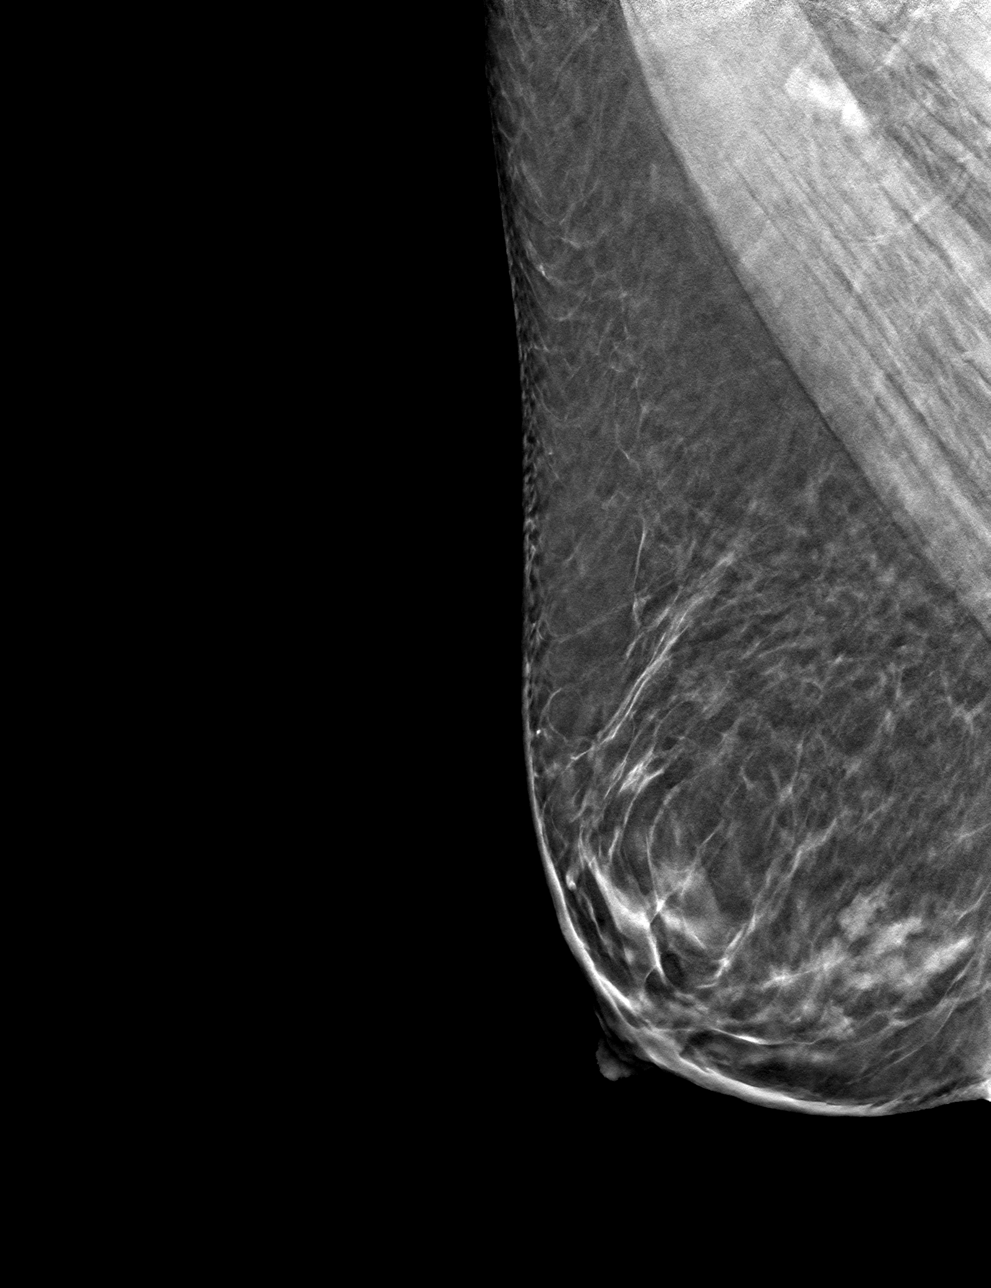

[R CC tomo · tomo slice 29/57.0]
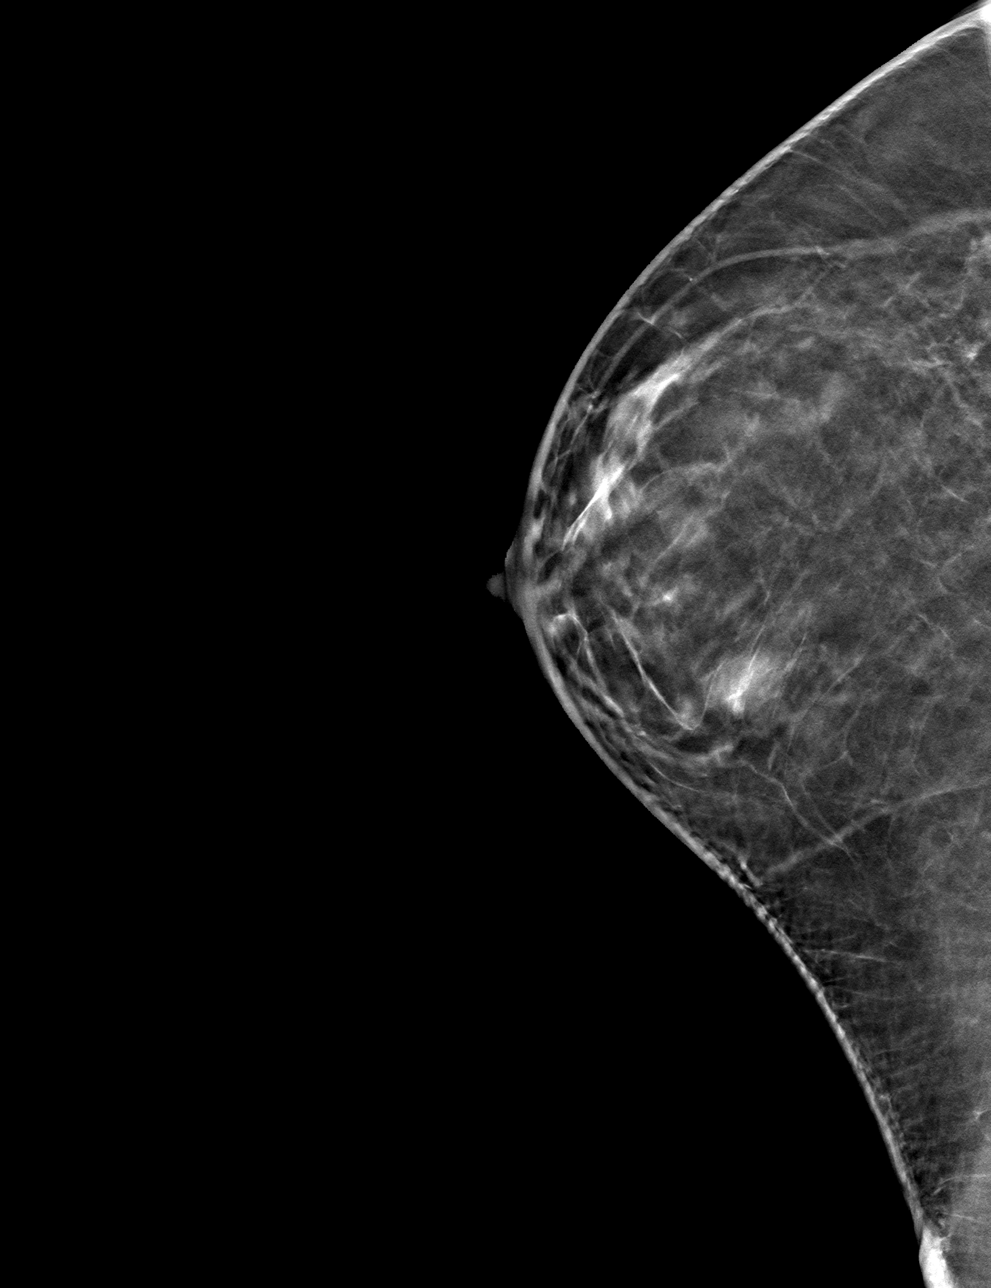

[4 of 12 positions shown; findings below may reference images not displayed]

ACR Breast Density Category c: The breast tissue is heterogeneously
dense, which may obscure small masses.
FINDINGS: The focal asymmetry in the medial right breast is mammographically
stable.

Mammographic images were processed with CAD.

Ultrasound of the right breast at 3 o'clock, 3 cm from the nipple
demonstrates a stable hypoechoic mass measuring 1.0 x 0.6 x 0.7 cm,
previously measuring 1.0 x 0.6 x 0.4 cm.
IMPRESSION: 1.  The likely benign mass in the medial right breast is stable.

RECOMMENDATION:
Bilateral diagnostic mammogram and right breast ultrasound is
recommended in Wednesday October, 2019.

I have discussed the findings and recommendations with the patient.
If applicable, a reminder letter will be sent to the patient
regarding the next appointment.

BI-RADS CATEGORY  3: Probably benign.

## 2022-03-27 ENCOUNTER — Other Ambulatory Visit: Payer: Self-pay | Admitting: Physician Assistant

## 2022-03-27 DIAGNOSIS — E1159 Type 2 diabetes mellitus with other circulatory complications: Secondary | ICD-10-CM

## 2022-05-22 ENCOUNTER — Other Ambulatory Visit: Payer: Self-pay | Admitting: Physician Assistant

## 2022-05-22 DIAGNOSIS — E282 Polycystic ovarian syndrome: Secondary | ICD-10-CM

## 2022-05-22 DIAGNOSIS — E119 Type 2 diabetes mellitus without complications: Secondary | ICD-10-CM

## 2022-05-22 DIAGNOSIS — I152 Hypertension secondary to endocrine disorders: Secondary | ICD-10-CM

## 2022-06-29 ENCOUNTER — Ambulatory Visit (INDEPENDENT_AMBULATORY_CARE_PROVIDER_SITE_OTHER): Payer: BC Managed Care – PPO | Admitting: Physician Assistant

## 2022-06-29 ENCOUNTER — Encounter: Payer: Self-pay | Admitting: Physician Assistant

## 2022-06-29 VITALS — BP 125/98 | HR 114 | Temp 98.2°F | Resp 14 | Ht 63.0 in | Wt 156.5 lb

## 2022-06-29 DIAGNOSIS — Z0000 Encounter for general adult medical examination without abnormal findings: Secondary | ICD-10-CM | POA: Diagnosis not present

## 2022-06-29 DIAGNOSIS — E1169 Type 2 diabetes mellitus with other specified complication: Secondary | ICD-10-CM | POA: Diagnosis not present

## 2022-06-29 DIAGNOSIS — E1159 Type 2 diabetes mellitus with other circulatory complications: Secondary | ICD-10-CM

## 2022-06-29 DIAGNOSIS — R000 Tachycardia, unspecified: Secondary | ICD-10-CM

## 2022-06-29 DIAGNOSIS — E119 Type 2 diabetes mellitus without complications: Secondary | ICD-10-CM

## 2022-06-29 DIAGNOSIS — I152 Hypertension secondary to endocrine disorders: Secondary | ICD-10-CM

## 2022-06-29 DIAGNOSIS — E785 Hyperlipidemia, unspecified: Secondary | ICD-10-CM

## 2022-06-29 NOTE — Progress Notes (Unsigned)
I,Vanessa  Vital,acting as a Neurosurgeon for [employer], PA-C.,have documented all relevant documentation on the behalf of [name], PA-C,as directed by  [name], PA-C while in the presence of [name], PA-C.    Complete physical exam   Patient: Tracey Kennedy   DOB: 1980/12/13   42 y.o. Female  MRN: [id number] Visit Date: 06/29/2022  Today's healthcare provider: [name], PA-C   Cc. cpe  Subjective    Tracey Kennedy is a 42 y.o. female who presents today for a complete physical exam.  She reports consuming a general diet.She generally feels well. She reports sleeping fairly well. She does not have additional problems to discuss today.  HPI  Shoes and socks off needs foot exam'  Past Medical History:  Diagnosis Date   Diabetes mellitus without complication (HCC)    Hypertension    Past Surgical History:  Procedure Laterality Date   CHOLECYSTECTOMY     Social History   Socioeconomic History   Marital status: Married    Spouse name: Not on file   Number of children: Not on file   Years of education: Not on file   Highest education level: Not on file  Occupational History   Not on file  Tobacco Use   Smoking status: Never   Smokeless tobacco: Never  Vaping Use   Vaping Use: Never used  Substance and Sexual Activity   Alcohol use: No   Drug use: No   Sexual activity: Yes  Other Topics Concern   Not on file  Social History Narrative   Not on file   Social Determinants of Health   Financial Resource Strain: Not on file  Food Insecurity: Not on file  Transportation Needs: Not on file  Physical Activity: Not on file  Stress: Not on file  Social Connections: Not on file  Intimate Partner Violence: Not on file   Family Status  Relation Name Status   Mother  Deceased       07-09-06 of multiple myeloma   Father  Alive   Sister  Alive   Brother  Alive   PGM  Deceased   PGF  Deceased   H Brother  Alive   H Brother   Alive   H Sister  Alive   Family History  Problem Relation Age of Onset   Multiple myeloma Mother    Diabetes Mother    Hypertension Father    Hyperlipidemia Father    Diabetes Father    Cirrhosis Father    Polycystic ovary syndrome Sister    Healthy Brother    Bone cancer Paternal Grandmother    Heart disease Paternal Grandfather    Stroke Paternal Grandfather    Healthy Half-Brother    Healthy Half-Brother    Healthy Half-Sister    Allergies  Allergen Reactions   Penicillins     Patient Care Team: [name], PA-C as PCP - General (Physician Assistant) Huel Cote, MD as Consulting Physician (Obstetrics and Gynecology)   Medications: Outpatient Medications Prior to Visit  Medication Sig   CAMILA 0.35 MG tablet Take 1 tablet by mouth daily.   glucose blood (ONE TOUCH ULTRA TEST) test strip Check blood glucose readings ACHS   Lancets (ONETOUCH ULTRASOFT) lancets Check blood sugar ACHS   losartan (COZAAR) 25 MG tablet TAKE 1 TABLET (25 MG TOTAL) BY MOUTH DAILY.   metFORMIN (GLUCOPHAGE) 500 MG tablet TAKE 2 TABLETS BY MOUTH EVERY MORNING AND 1 TABLET BY MOUTH EVERY EVENING  predniSONE (DELTASONE) 20 MG tablet Take 1 tablet (20 mg total) by mouth daily with breakfast.   triamterene-hydrochlorothiazide (MAXZIDE-25) 37.5-25 MG tablet TAKE 1 TABLET BY MOUTH EVERY DAY   No facility-administered medications prior to visit.    Review of Systems  Constitutional:  Negative for fatigue and fever.  Respiratory:  Negative for cough and shortness of breath.   Cardiovascular:  Negative for chest pain and leg swelling.  Gastrointestinal:  Negative for abdominal pain.  Neurological:  Negative for dizziness and headaches.     Objective    There were no vitals taken for this visit.  Physical Exam Constitutional:      General: She is awake.     Appearance: She is well-developed. She is not ill-appearing.  HENT:     Head: Normocephalic.     Right Ear: Tympanic  membrane normal.     Left Ear: Tympanic membrane normal.     Nose: Nose normal. No congestion or rhinorrhea.     Mouth/Throat:     Pharynx: No oropharyngeal exudate or posterior oropharyngeal erythema.  Eyes:     Conjunctiva/sclera: Conjunctivae normal.     Pupils: Pupils are equal, round, and reactive to light.  Neck:     Thyroid: No thyroid mass or thyromegaly.  Cardiovascular:     Rate and Rhythm: Normal rate and regular rhythm.     Pulses:          Dorsalis pedis pulses are 3+ on the right side and 3+ on the left side.       Posterior tibial pulses are 3+ on the right side.     Heart sounds: Normal heart sounds.  Pulmonary:     Effort: Pulmonary effort is normal.     Breath sounds: Normal breath sounds.  Abdominal:     Palpations: Abdomen is soft.     Tenderness: There is no abdominal tenderness.  Musculoskeletal:     Right lower leg: No swelling. No edema.     Left lower leg: No swelling. No edema.  Feet:     Right foot:     Protective Sensation: 4 sites tested.  4 sites sensed.     Skin integrity: Skin integrity normal.     Toenail Condition: Right toenails are normal.     Left foot:     Protective Sensation: 4 sites tested.  4 sites sensed.     Skin integrity: Skin integrity normal.     Toenail Condition: Left toenails are normal.  Lymphadenopathy:     Cervical: No cervical adenopathy.  Skin:    General: Skin is warm.  Neurological:     Mental Status: She is alert and oriented to person, place, and time.  Psychiatric:        Attention and Perception: Attention normal.        Mood and Affect: Mood normal.        Speech: Speech normal.        Behavior: Behavior normal. Behavior is cooperative.      Last depression screening scores    10/15/2021    4:16 PM 04/06/2021   11:38 AM 04/01/2020    8:24 AM  PHQ 2/9 Scores  PHQ - 2 Score 0 0 0  PHQ- 9 Score 0 1 1   Last fall risk screening    10/15/2021    4:16 PM  Fall Risk   Falls in the past year? 0  Number  falls in past yr: 0  Injury with Fall? 0  Risk for fall due to : No Fall Risks  Follow up Falls evaluation completed   Last Audit-C alcohol use screening    10/15/2021    4:16 PM  Alcohol Use Disorder Test (AUDIT)  1. How often do you have a drink containing alcohol? 2  2. How many drinks containing alcohol do you have on a typical day when you are drinking? 0  3. How often do you have six or more drinks on one occasion? 0  AUDIT-C Score 2   A score of 3 or more in women, and 4 or more in men indicates increased risk for alcohol abuse, EXCEPT if all of the points are from question 1   No results found for any visits on 06/29/22.  Assessment & Plan    Routine Health Maintenance and Physical Exam  Exercise Activities and Dietary recommendations --balanced diet high in fiber and protein, low in sugars, carbs, fats. --physical activity/exercise 30 minutes 3-5 times a week     Immunization History  Administered Date(s) Administered   Influenza,inj,Quad PF,6+ Mos 11/08/2018, 10/15/2021   Influenza,inj,Quad PF,6-35 Mos 11/12/2020   PFIZER(Purple Top)SARS-COV-2 Vaccination 04/07/2019, 04/30/2019, 02/18/2020   Td 04/06/2021    Health Maintenance  Topic Date Due   COVID-19 Vaccine (4 - 2023-24 season) 10/08/2021   Diabetic kidney evaluation - eGFR measurement  04/06/2022   Diabetic kidney evaluation - Urine ACR  04/06/2022   FOOT EXAM  04/06/2022   HEMOGLOBIN A1C  04/15/2022   PAP SMEAR-Modifier  10/09/2022 (Originally 08/03/2020)   OPHTHALMOLOGY EXAM  08/20/2022   INFLUENZA VACCINE  09/08/2022   DTaP/Tdap/Td (2 - Tdap) 04/07/2031   Hepatitis C Screening  Completed   HIV Screening  Completed   HPV VACCINES  Aged Out    Discussed health benefits of physical activity, and encouraged her to engage in regular exercise appropriate for her age and condition.  ***  No follow-ups on file.     I, [name], PA-C have reviewed all documentation for this visit. The  documentation on  06/29/22   for the exam, diagnosis, procedures, and orders are all accurate and complete.  [name], PA-C [facility] [address] [place], [place], [address] Office: [phone] Fax: [phone]   [facility]

## 2022-06-30 ENCOUNTER — Encounter: Payer: Self-pay | Admitting: Physician Assistant

## 2022-06-30 NOTE — Assessment & Plan Note (Signed)
Repeat fasting lipids LDL goal < 70  Holding off on statin thx until ~ 43  The 10-year ASCVD risk score (Arnett DK, et al., 2019) is: 1.7%

## 2022-06-30 NOTE — Assessment & Plan Note (Signed)
Mild  Zio did not reveal anything concerning and highest was 103. Likely d/t pt being in office

## 2022-06-30 NOTE — Assessment & Plan Note (Signed)
Slightly elevated in office, typically normal ranges at home  Cont losartan 25 mg  Ordered cmp F.u 39mo

## 2022-06-30 NOTE — Assessment & Plan Note (Signed)
Manages w/ met 500 mg  Usually well controlled repeat A1c today  No statin d/t age, on arb Uacr ordered Foot exam today F.u 6 mo

## 2022-07-21 ENCOUNTER — Encounter: Payer: Self-pay | Admitting: Physician Assistant

## 2022-07-21 LAB — CBC WITH DIFFERENTIAL/PLATELET
Basophils Absolute: 0.1 10*3/uL (ref 0.0–0.2)
Basos: 1 %
EOS (ABSOLUTE): 0.2 10*3/uL (ref 0.0–0.4)
Eos: 3 %
Hematocrit: 38.9 % (ref 34.0–46.6)
Hemoglobin: 13.2 g/dL (ref 11.1–15.9)
Immature Grans (Abs): 0 10*3/uL (ref 0.0–0.1)
Immature Granulocytes: 0 %
Lymphocytes Absolute: 2.1 10*3/uL (ref 0.7–3.1)
Lymphs: 25 %
MCH: 28.4 pg (ref 26.6–33.0)
MCHC: 33.9 g/dL (ref 31.5–35.7)
MCV: 84 fL (ref 79–97)
Monocytes Absolute: 0.5 10*3/uL (ref 0.1–0.9)
Monocytes: 6 %
Neutrophils Absolute: 5.6 10*3/uL (ref 1.4–7.0)
Neutrophils: 65 %
Platelets: 376 10*3/uL (ref 150–450)
RBC: 4.64 x10E6/uL (ref 3.77–5.28)
RDW: 12.8 % (ref 11.7–15.4)
WBC: 8.5 10*3/uL (ref 3.4–10.8)

## 2022-07-21 LAB — COMPREHENSIVE METABOLIC PANEL
ALT: 14 IU/L (ref 0–32)
AST: 16 IU/L (ref 0–40)
Albumin/Globulin Ratio: 1.9
Albumin: 4.6 g/dL (ref 3.9–4.9)
Alkaline Phosphatase: 72 IU/L (ref 44–121)
BUN/Creatinine Ratio: 16 (ref 9–23)
BUN: 12 mg/dL (ref 6–24)
Bilirubin Total: 0.6 mg/dL (ref 0.0–1.2)
CO2: 22 mmol/L (ref 20–29)
Calcium: 9.6 mg/dL (ref 8.7–10.2)
Chloride: 99 mmol/L (ref 96–106)
Creatinine, Ser: 0.73 mg/dL (ref 0.57–1.00)
Globulin, Total: 2.4 g/dL (ref 1.5–4.5)
Glucose: 134 mg/dL — ABNORMAL HIGH (ref 70–99)
Potassium: 3.8 mmol/L (ref 3.5–5.2)
Sodium: 136 mmol/L (ref 134–144)
Total Protein: 7 g/dL (ref 6.0–8.5)
eGFR: 105 mL/min/{1.73_m2} (ref >59)

## 2022-07-21 LAB — LIPID PANEL
Chol/HDL Ratio: 4 ratio (ref 0.0–4.4)
Cholesterol, Total: 190 mg/dL (ref 100–199)
HDL: 48 mg/dL (ref >39)
LDL Chol Calc (NIH): 125 mg/dL — ABNORMAL HIGH (ref 0–99)
Triglycerides: 94 mg/dL (ref 0–149)
VLDL Cholesterol Cal: 17 mg/dL (ref 5–40)

## 2022-07-21 LAB — HEMOGLOBIN A1C
Est. average glucose Bld gHb Est-mCnc: 146 mg/dL
Hgb A1c MFr Bld: 6.7 % — ABNORMAL HIGH (ref 4.8–5.6)

## 2022-07-21 LAB — HM MAMMOGRAPHY

## 2022-07-21 LAB — MICROALBUMIN / CREATININE URINE RATIO
Creatinine, Urine: 158.1 mg/dL
Microalb/Creat Ratio: 5 mg/g creat (ref 0–29)
Microalbumin, Urine: 8.1 ug/mL

## 2022-07-21 LAB — TSH: TSH: 2.39 u[IU]/mL (ref 0.450–4.500)

## 2022-07-25 ENCOUNTER — Other Ambulatory Visit: Payer: Self-pay | Admitting: Physician Assistant

## 2022-07-25 DIAGNOSIS — E119 Type 2 diabetes mellitus without complications: Secondary | ICD-10-CM

## 2022-07-25 MED ORDER — METFORMIN HCL ER (MOD) 1000 MG PO TB24
2000.0000 mg | ORAL_TABLET | Freq: Every day | ORAL | 1 refills | Status: DC
Start: 2022-07-25 — End: 2022-07-25

## 2022-07-25 MED ORDER — METFORMIN HCL ER 500 MG PO TB24
2000.0000 mg | ORAL_TABLET | Freq: Every day | ORAL | 1 refills | Status: DC
Start: 2022-07-25 — End: 2022-09-21

## 2022-08-02 LAB — HM PAP SMEAR: HPV, high-risk: NEGATIVE

## 2022-08-23 LAB — HM DIABETES EYE EXAM

## 2022-09-18 ENCOUNTER — Other Ambulatory Visit: Payer: Self-pay | Admitting: Physician Assistant

## 2022-09-18 DIAGNOSIS — E119 Type 2 diabetes mellitus without complications: Secondary | ICD-10-CM

## 2022-09-21 ENCOUNTER — Other Ambulatory Visit: Payer: Self-pay | Admitting: Physician Assistant

## 2022-09-21 DIAGNOSIS — E119 Type 2 diabetes mellitus without complications: Secondary | ICD-10-CM

## 2022-09-28 ENCOUNTER — Other Ambulatory Visit: Payer: Self-pay | Admitting: Physician Assistant

## 2022-09-28 DIAGNOSIS — I152 Hypertension secondary to endocrine disorders: Secondary | ICD-10-CM

## 2022-12-12 NOTE — Progress Notes (Signed)
Established patient visit   Patient: Tracey Kennedy   DOB: 09-Mar-1980   41 y.o. Female  MRN: [id number] Visit Date: 12/13/2022  Today's healthcare provider: Sherlyn Hay, DO   Chief Complaint  Patient presents with   Follow-up   Subjective    HPI Last annual physical: 06/29/2022 Lab work done 07/20/2022  Swollen lymph node right neck - first noticed Friday  - tender  Fasting BG 120s  - on metformin  Had mammogram in June 2024 - normal per pt  - [facility]     Medications: Outpatient Medications Prior to Visit  Medication Sig   CAMILA 0.35 MG tablet Take 1 tablet by mouth daily.   Drospirenone (SLYND) 4 MG TABS Take by mouth.   Lancets (ONETOUCH ULTRASOFT) lancets Check blood sugar ACHS   [DISCONTINUED] glucose blood (ONE TOUCH ULTRA TEST) test strip Check blood glucose readings ACHS   [DISCONTINUED] losartan (COZAAR) 25 MG tablet TAKE 1 TABLET (25 MG TOTAL) BY MOUTH DAILY.   [DISCONTINUED] metFORMIN (GLUCOPHAGE-XR) 500 MG 24 hr tablet Take 4 tablets (2,000 mg total) by mouth daily. With meal   [DISCONTINUED] triamterene-hydrochlorothiazide (MAXZIDE-25) 37.5-25 MG tablet TAKE 1 TABLET BY MOUTH EVERY DAY   No facility-administered medications prior to visit.    Review of Systems  Constitutional:  Negative for appetite change, chills, fatigue and fever.  HENT:  Negative for congestion, ear discharge, ear pain, postnasal drip, rhinorrhea, sinus pressure, sinus pain, sore throat and trouble swallowing.        Swelling of lymph node on right  Eyes:  Negative for visual disturbance.  Respiratory:  Negative for chest tightness and shortness of breath.   Cardiovascular:  Negative for chest pain and palpitations.  Gastrointestinal:  Negative for abdominal pain, nausea and vomiting.  Neurological:  Negative for dizziness, weakness and headaches.        Objective    BP 116/85 (BP Location: Left Arm, Patient Position: Sitting, Cuff Size: Normal)    Pulse (!) 103   Ht 5\' 3"  (1.6 m)   Wt 153 lb (69.4 kg)   LMP 12/04/2022   SpO2 98%   BMI 27.10 kg/m     Physical Exam Vitals and nursing note reviewed.  Constitutional:      General: She is not in acute distress.    Appearance: Normal appearance.  HENT:     Head: Normocephalic and atraumatic.  Eyes:     General: No scleral icterus.    Conjunctiva/sclera: Conjunctivae normal.  Neck:   Cardiovascular:     Rate and Rhythm: Normal rate.  Pulmonary:     Effort: Pulmonary effort is normal.  Lymphadenopathy:     Cervical: Cervical adenopathy (protruding) present.  Neurological:     Mental Status: She is alert and oriented to person, place, and time. Mental status is at baseline.  Psychiatric:        Mood and Affect: Mood normal.        Behavior: Behavior normal.      Results for orders placed or performed in visit on 12/13/22  Hemoglobin A1c  Result Value Ref Range   Hgb A1c MFr Bld 6.7 (H) 4.8 - 5.6 %   Est. average glucose Bld gHb Est-mCnc 146 mg/dL  CBC with Differential/Platelet  Result Value Ref Range   WBC 9.8 3.4 - 10.8 x10E3/uL   RBC 4.75 3.77 - 5.28 x10E6/uL   Hemoglobin 13.9 11.1 - 15.9 g/dL   Hematocrit 40.9 81.1 - 46.6 %  MCV 90 79 - 97 fL   MCH 29.3 26.6 - 33.0 pg   MCHC 32.7 31.5 - 35.7 g/dL   RDW 16.1 09.6 - 04.5 %   Platelets 394 150 - 450 x10E3/uL   Neutrophils 67 Not Estab. %   Lymphs 22 Not Estab. %   Monocytes 7 Not Estab. %   Eos 3 Not Estab. %   Basos 1 Not Estab. %   Neutrophils Absolute 6.6 1.4 - 7.0 x10E3/uL   Lymphocytes Absolute 2.1 0.7 - 3.1 x10E3/uL   Monocytes Absolute 0.7 0.1 - 0.9 x10E3/uL   EOS (ABSOLUTE) 0.3 0.0 - 0.4 x10E3/uL   Basophils Absolute 0.1 0.0 - 0.2 x10E3/uL   Immature Granulocytes 0 Not Estab. %   Immature Grans (Abs) 0.0 0.0 - 0.1 x10E3/uL    Assessment & Plan    Hypertension associated with diabetes (HCC) Assessment & Plan: Hypertension: Blood pressure well-controlled today. Continue losartan 25 mg  daily; refilled today. Continue triamterene-hydrochlorothiazide 37.5-25 mg daily; refilled today.  Diabetes mellitus type 2: Blood glucoses well-controlled, based on reported fasting readings. Will check hemoglobin A1c today. Continue metformin ER 1000 mg twice daily with a meal  Orders: -     Hemoglobin A1c -     Glucose Blood; Check blood glucose readings ACHS  Dispense: 300 each; Refill: 12 -     Triamterene-HCTZ; Take 1 tablet by mouth daily.  Dispense: 90 tablet; Refill: 1 -     Losartan Potassium; Take 1 tablet (25 mg total) by mouth daily.  Dispense: 90 tablet; Refill: 0 -     metFORMIN HCl ER; Take 2 tablets (1,000 mg total) by mouth 2 (two) times daily with a meal. With meal  Dispense: 360 tablet; Refill: 3  Hyperlipidemia associated with type 2 diabetes mellitus (HCC) Assessment & Plan: Discussed the importance of statin therapy in patients with diabetes, particularly over the age of 40 years. Will start rosuvastatin as noted below.  Orders: -     Rosuvastatin Calcium; Take 1 tablet (10 mg total) by mouth daily.  Dispense: 90 tablet; Refill: 3  Acute cervical lymphadenitis Assessment & Plan: Patient presents with tender right anterior cervical unilateral lymphadenopathy, with an absence of associated symptoms.    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 -Will check a CBC to evaluate for extensiveness of infection. -Will check an ultrasound to better characterize the region and determine if it is in fact lymphadenopathy versus an abscess. -Will treat presumptively as acute cervical lymphadenitis with clindamycin due to patient's allergy to penicillin, which she reports was significant enough to place her in the hospital as an infant.  Orders: -     CBC with Differential/Platelet -     US SOFT TISSUE HEAD & NECK (NON-THYROID); Future -      Clindamycin HCl; Take 1 capsule (300 mg total) by mouth 3 (three) times daily.  Dispense: 30 capsule; Refill: 0   Return in about 6 months (around 06/12/2023) for CPE.      I discussed the assessment and treatment plan with the patient  The patient was provided an opportunity to ask questions and all were answered. The patient agreed with the plan and demonstrated an understanding of the instructions.   The patient was advised to call back or seek an in-person evaluation if the symptoms worsen or if the condition fails to improve  as anticipated.    Sherlyn Hay, DO  [facility] [phone] (phone) [phone] (fax)  [facility]

## 2022-12-13 ENCOUNTER — Ambulatory Visit: Payer: BC Managed Care – PPO | Admitting: Family Medicine

## 2022-12-13 ENCOUNTER — Encounter: Payer: Self-pay | Admitting: Family Medicine

## 2022-12-13 VITALS — BP 116/85 | HR 103 | Ht 63.0 in | Wt 153.0 lb

## 2022-12-13 DIAGNOSIS — E1169 Type 2 diabetes mellitus with other specified complication: Secondary | ICD-10-CM

## 2022-12-13 DIAGNOSIS — L040 Acute lymphadenitis of face, head and neck: Secondary | ICD-10-CM | POA: Diagnosis not present

## 2022-12-13 DIAGNOSIS — I152 Hypertension secondary to endocrine disorders: Secondary | ICD-10-CM

## 2022-12-13 DIAGNOSIS — E785 Hyperlipidemia, unspecified: Secondary | ICD-10-CM

## 2022-12-13 DIAGNOSIS — E1159 Type 2 diabetes mellitus with other circulatory complications: Secondary | ICD-10-CM

## 2022-12-13 DIAGNOSIS — Z7984 Long term (current) use of oral hypoglycemic drugs: Secondary | ICD-10-CM

## 2022-12-13 MED ORDER — CLINDAMYCIN HCL 300 MG PO CAPS: 300.0000 mg | ORAL_CAPSULE | Freq: Three times a day (TID) | ORAL | 0 refills | Status: DC

## 2022-12-13 MED ORDER — LOSARTAN POTASSIUM 25 MG PO TABS: 25.0000 mg | ORAL_TABLET | Freq: Every day | ORAL | 0 refills | Status: DC

## 2022-12-13 MED ORDER — GLUCOSE BLOOD VI STRP: ORAL_STRIP | 12 refills | Status: DC

## 2022-12-13 MED ORDER — TRIAMTERENE-HCTZ 37.5-25 MG PO TABS: 1.0000 | ORAL_TABLET | Freq: Every day | ORAL | 1 refills | Status: DC

## 2022-12-13 MED ORDER — ROSUVASTATIN CALCIUM 10 MG PO TABS: 10.0000 mg | ORAL_TABLET | Freq: Every day | ORAL | 3 refills | Status: DC

## 2022-12-13 MED ORDER — METFORMIN HCL ER 500 MG PO TB24: 1000.0000 mg | ORAL_TABLET | Freq: Two times a day (BID) | ORAL | 3 refills | Status: DC

## 2022-12-14 LAB — HEMOGLOBIN A1C
Est. average glucose Bld gHb Est-mCnc: 146 mg/dL
Hgb A1c MFr Bld: 6.7 % — ABNORMAL HIGH (ref 4.8–5.6)

## 2022-12-14 LAB — CBC WITH DIFFERENTIAL/PLATELET
Basophils Absolute: 0.1 10*3/uL (ref 0.0–0.2)
Basos: 1 %
EOS (ABSOLUTE): 0.3 10*3/uL (ref 0.0–0.4)
Eos: 3 %
Hematocrit: 42.5 % (ref 34.0–46.6)
Hemoglobin: 13.9 g/dL (ref 11.1–15.9)
Immature Grans (Abs): 0 10*3/uL (ref 0.0–0.1)
Immature Granulocytes: 0 %
Lymphocytes Absolute: 2.1 10*3/uL (ref 0.7–3.1)
Lymphs: 22 %
MCH: 29.3 pg (ref 26.6–33.0)
MCHC: 32.7 g/dL (ref 31.5–35.7)
MCV: 90 fL (ref 79–97)
Monocytes Absolute: 0.7 10*3/uL (ref 0.1–0.9)
Monocytes: 7 %
Neutrophils Absolute: 6.6 10*3/uL (ref 1.4–7.0)
Neutrophils: 67 %
Platelets: 394 10*3/uL (ref 150–450)
RBC: 4.75 x10E6/uL (ref 3.77–5.28)
RDW: 12.5 % (ref 11.7–15.4)
WBC: 9.8 10*3/uL (ref 3.4–10.8)

## 2022-12-15 ENCOUNTER — Ambulatory Visit
Admission: RE | Admit: 2022-12-15 | Discharge: 2022-12-15 | Disposition: A | Discharge status: Home / Self Care | Payer: BC Managed Care – PPO | Source: Ambulatory Visit | Attending: Family Medicine | Admitting: Family Medicine

## 2022-12-15 DIAGNOSIS — L040 Acute lymphadenitis of face, head and neck: Secondary | ICD-10-CM | POA: Diagnosis present

## 2022-12-19 ENCOUNTER — Encounter: Payer: Self-pay | Admitting: Family Medicine

## 2022-12-19 DIAGNOSIS — E119 Type 2 diabetes mellitus without complications: Secondary | ICD-10-CM

## 2022-12-19 DIAGNOSIS — E041 Nontoxic single thyroid nodule: Secondary | ICD-10-CM

## 2022-12-24 ENCOUNTER — Encounter: Payer: Self-pay | Admitting: Family Medicine

## 2022-12-24 DIAGNOSIS — L040 Acute lymphadenitis of face, head and neck: Secondary | ICD-10-CM | POA: Insufficient documentation

## 2022-12-24 NOTE — Assessment & Plan Note (Addendum)
Patient presents with tender right anterior cervical unilateral lymphadenopathy, with an absence of associated symptoms.    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 -Will check a CBC to evaluate for extensiveness of infection. -Will check an ultrasound to better characterize the region and determine if it is in fact lymphadenopathy versus an abscess. -Will treat presumptively as acute cervical lymphadenitis with clindamycin due to patient's allergy to penicillin, which she reports was significant enough to place her in the hospital as an infant.

## 2022-12-24 NOTE — Assessment & Plan Note (Addendum)
Hypertension: Blood pressure well-controlled today. Continue losartan 25 mg daily; refilled today. Continue triamterene-hydrochlorothiazide 37.5-25 mg daily; refilled today.  Diabetes mellitus type 2: Blood glucoses well-controlled, based on reported fasting readings. Will check hemoglobin A1c today. Continue metformin ER 1000 mg twice daily with a meal

## 2022-12-24 NOTE — Assessment & Plan Note (Signed)
Discussed the importance of statin therapy in patients with diabetes, particularly over the age of 40 years. Will start rosuvastatin as noted below.

## 2022-12-29 MED ORDER — TIRZEPATIDE 2.5 MG/0.5ML ~~LOC~~ SOAJ: 2.5000 mg | SUBCUTANEOUS | 1 refills | Status: DC

## 2022-12-29 NOTE — Addendum Note (Signed)
Addended by: Jacquenette Shone on: 12/29/2022 04:28 PM   Modules accepted: Orders

## 2023-01-02 ENCOUNTER — Other Ambulatory Visit: Payer: BC Managed Care – PPO

## 2023-01-02 ENCOUNTER — Ambulatory Visit
Admission: RE | Admit: 2023-01-02 | Discharge: 2023-01-02 | Disposition: A | Discharge status: Home / Self Care | Payer: BC Managed Care – PPO | Source: Ambulatory Visit | Attending: Family Medicine | Admitting: Family Medicine

## 2023-01-11 ENCOUNTER — Telehealth: Payer: Self-pay | Admitting: Family Medicine

## 2023-01-11 NOTE — Telephone Encounter (Signed)
Patient has called in regards to her Thyroid U/S she had done on 01/02/2023 and states that she has not received a call back about the results. She states she saw on MyChart where these results were uploaded last week and is requsting a call back as soon as possible in regards to these results. Please advise and follow back up with the patient at phone #[phone].

## 2023-01-13 NOTE — Addendum Note (Signed)
Addended by: Jacquenette Shone on: 01/13/2023 07:51 AM   Modules accepted: Orders

## 2023-01-15 ENCOUNTER — Encounter: Payer: Self-pay | Admitting: Family Medicine

## 2023-02-02 ENCOUNTER — Encounter: Payer: Self-pay | Admitting: Family Medicine

## 2023-02-02 DIAGNOSIS — E041 Nontoxic single thyroid nodule: Secondary | ICD-10-CM

## 2023-02-02 DIAGNOSIS — E119 Type 2 diabetes mellitus without complications: Secondary | ICD-10-CM

## 2023-02-03 ENCOUNTER — Encounter: Payer: Self-pay | Admitting: Family Medicine

## 2023-02-03 NOTE — Telephone Encounter (Signed)
Addressed in separate message (from 02/02/23).

## 2023-02-07 NOTE — Progress Notes (Signed)
 Patient for US  guided FNA Bil Thyroid  Nodule Biopsies on Thurs 02/09/23, I called and spoke with the patient on the phone and gave pre-procedure instructions. Pt was made aware to be here at 12:30p and check in at the [facility] registration desk. Pt stated understanding.  Called 02/06/2023

## 2023-02-09 ENCOUNTER — Ambulatory Visit
Admission: RE | Admit: 2023-02-09 | Discharge: 2023-02-09 | Disposition: A | Discharge status: Home / Self Care | Payer: 59 | Source: Ambulatory Visit | Attending: Family Medicine | Admitting: Family Medicine

## 2023-02-09 DIAGNOSIS — C73 Malignant neoplasm of thyroid gland: Secondary | ICD-10-CM | POA: Insufficient documentation

## 2023-02-09 DIAGNOSIS — E041 Nontoxic single thyroid nodule: Secondary | ICD-10-CM

## 2023-02-09 DIAGNOSIS — E042 Nontoxic multinodular goiter: Secondary | ICD-10-CM | POA: Diagnosis present

## 2023-02-09 MED ORDER — LIDOCAINE HCL (PF) 1 % IJ SOLN
10.0000 mL | Freq: Once | INTRAMUSCULAR | Status: AC
  Administered 2023-02-09: 10 mL via INTRADERMAL
  Filled 2023-02-09: qty 10

## 2023-02-09 NOTE — Procedures (Signed)
 Successful US guided FNA of right mid thyroid nodule #1. Successful US guided FNA of right mid thyroid nodule #2. No complications. See PACS for full report.  Pattricia Boss D, PA-C 02/09/2023, 2:02 PM

## 2023-02-09 NOTE — Discharge Instructions (Signed)
 TW/ KDD

## 2023-02-12 ENCOUNTER — Encounter: Payer: Self-pay | Admitting: Family Medicine

## 2023-02-15 ENCOUNTER — Encounter: Payer: Self-pay | Admitting: Family Medicine

## 2023-02-15 DIAGNOSIS — E119 Type 2 diabetes mellitus without complications: Secondary | ICD-10-CM

## 2023-02-15 DIAGNOSIS — C73 Malignant neoplasm of thyroid gland: Secondary | ICD-10-CM | POA: Insufficient documentation

## 2023-02-15 DIAGNOSIS — E041 Nontoxic single thyroid nodule: Secondary | ICD-10-CM | POA: Insufficient documentation

## 2023-02-15 LAB — CYTOLOGY - NON PAP

## 2023-02-15 MED ORDER — TIRZEPATIDE 5 MG/0.5ML ~~LOC~~ SOAJ: 5.0000 mg | SUBCUTANEOUS | 1 refills | Status: DC

## 2023-02-15 NOTE — Telephone Encounter (Signed)
 Received results of patient's biopsy from pathology, Dr. Annah Carmin.  Subsequently called and discussed results with the patient.  Discussed next steps with her and answered the questions she had.  Submitted surgical oncology referral.  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

## 2023-02-16 ENCOUNTER — Other Ambulatory Visit: Payer: Self-pay | Admitting: Family Medicine

## 2023-02-16 DIAGNOSIS — C73 Malignant neoplasm of thyroid gland: Secondary | ICD-10-CM

## 2023-02-16 DIAGNOSIS — E041 Nontoxic single thyroid nodule: Secondary | ICD-10-CM

## 2023-02-22 ENCOUNTER — Other Ambulatory Visit: Payer: Self-pay | Admitting: Otolaryngology

## 2023-02-22 DIAGNOSIS — C73 Malignant neoplasm of thyroid gland: Secondary | ICD-10-CM

## 2023-02-22 DIAGNOSIS — C801 Malignant (primary) neoplasm, unspecified: Secondary | ICD-10-CM

## 2023-02-23 ENCOUNTER — Other Ambulatory Visit: Payer: BC Managed Care – PPO

## 2023-03-06 ENCOUNTER — Encounter (HOSPITAL_COMMUNITY)
Admission: RE | Admit: 2023-03-06 | Discharge: 2023-03-06 | Disposition: A | Discharge status: Home / Self Care | Payer: 59 | Source: Ambulatory Visit | Attending: Otolaryngology | Admitting: Otolaryngology

## 2023-03-06 DIAGNOSIS — C73 Malignant neoplasm of thyroid gland: Secondary | ICD-10-CM | POA: Diagnosis present

## 2023-03-06 LAB — GLUCOSE, CAPILLARY: Glucose-Capillary: 100 mg/dL — ABNORMAL HIGH (ref 70–99)

## 2023-03-06 MED ORDER — FLUDEOXYGLUCOSE F - 18 (FDG) INJECTION
7.6000 | Freq: Once | INTRAVENOUS | Status: AC
Start: 2023-03-06 — End: 2023-03-06
  Administered 2023-03-06: 7.35 via INTRAVENOUS

## 2023-03-23 ENCOUNTER — Encounter: Payer: Self-pay | Admitting: Family Medicine

## 2023-03-23 ENCOUNTER — Other Ambulatory Visit: Payer: Self-pay | Admitting: Family Medicine

## 2023-03-23 ENCOUNTER — Ambulatory Visit: Payer: 59 | Admitting: Family Medicine

## 2023-03-23 VITALS — BP 125/91 | HR 102 | Ht 63.0 in | Wt 144.9 lb

## 2023-03-23 DIAGNOSIS — I152 Hypertension secondary to endocrine disorders: Secondary | ICD-10-CM

## 2023-03-23 DIAGNOSIS — I700 Atherosclerosis of aorta: Secondary | ICD-10-CM | POA: Diagnosis not present

## 2023-03-23 DIAGNOSIS — Z7984 Long term (current) use of oral hypoglycemic drugs: Secondary | ICD-10-CM

## 2023-03-23 DIAGNOSIS — E1159 Type 2 diabetes mellitus with other circulatory complications: Secondary | ICD-10-CM | POA: Diagnosis not present

## 2023-03-23 DIAGNOSIS — D219 Benign neoplasm of connective and other soft tissue, unspecified: Secondary | ICD-10-CM

## 2023-03-23 DIAGNOSIS — N852 Hypertrophy of uterus: Secondary | ICD-10-CM

## 2023-03-23 DIAGNOSIS — E1169 Type 2 diabetes mellitus with other specified complication: Secondary | ICD-10-CM | POA: Diagnosis not present

## 2023-03-23 DIAGNOSIS — C73 Malignant neoplasm of thyroid gland: Secondary | ICD-10-CM

## 2023-03-23 DIAGNOSIS — E785 Hyperlipidemia, unspecified: Secondary | ICD-10-CM

## 2023-03-23 DIAGNOSIS — D259 Leiomyoma of uterus, unspecified: Secondary | ICD-10-CM

## 2023-03-23 DIAGNOSIS — E119 Type 2 diabetes mellitus without complications: Secondary | ICD-10-CM

## 2023-03-23 MED ORDER — METFORMIN HCL ER 500 MG PO TB24: ORAL_TABLET | ORAL | 3 refills | Status: DC

## 2023-03-23 MED ORDER — TIRZEPATIDE 5 MG/0.5ML ~~LOC~~ SOAJ: 5.0000 mg | SUBCUTANEOUS | 3 refills | Status: DC

## 2023-03-23 MED ORDER — LOSARTAN POTASSIUM 25 MG PO TABS: 25.0000 mg | ORAL_TABLET | Freq: Every day | ORAL | 3 refills | Status: AC

## 2023-03-23 MED ORDER — TRIAMTERENE-HCTZ 37.5-25 MG PO TABS: 1.0000 | ORAL_TABLET | Freq: Every day | ORAL | 3 refills | Status: AC

## 2023-03-23 NOTE — Assessment & Plan Note (Signed)
On rosuvastatin 10 mg daily since November. Goal to reduce LDL below 70 mg/dL due to increased cardiovascular risk associated with diabetes. Mild aortic atherosclerosis noted on PET scan, managed with current statin therapy. - Continue rosuvastatin 10 mg daily - Recheck lipid panel at next physical in May

## 2023-03-23 NOTE — Progress Notes (Signed)
Established patient visit   Patient: Tracey Kennedy   DOB: 12-31-1980   43 y.o. Female  MRN: [id number] Visit Date: 03/23/2023  Today's healthcare provider: Shirlee Latch, MD   Chief Complaint  Patient presents with   Medical Management of Chronic Issues   Hypertension   Diabetes    Managed by endo. Last A1c completed at 03/21/23   Hyperlipidemia   Care Management    Mammogram - complete with gyn in [facility] Cervical Cancer Screening - completed in June Pneumococcal Vaccine - declined [place] - Patient reports that she already completed in November and is aware of fibroids. Imaging completed at [facility]   Subjective     HPI     Diabetes    Additional comments: Managed by endo. Last A1c completed at 03/21/23        Care Management    Additional comments: Mammogram - complete with gyn in [facility] Cervical Cancer Screening - completed in June Pneumococcal Vaccine - declined [place] - Patient reports that she already completed in November and is aware of fibroids. Imaging completed at [facility]      Last edited by Acey Lav, CMA on 03/23/2023  4:08 PM.       Discussed the use of AI scribe software for clinical note transcription with the patient, who gave verbal consent to proceed.  History of Present Illness   The patient, with a history of hypertension, diabetes, and high cholesterol, presents for a follow-up visit. She was recently diagnosed with thyroid cancer and is scheduled for a total thyroidectomy. The patient was referred to an endocrinologist who reviewed her diabetes management and did not make any changes to her current regimen. The patient's A1c has improved from 6.7 to 5.8 since starting [facility].  The patient also has fibroids and an enlarged uterus, which were discovered during a PET scan. The patient had an endometrial biopsy that came back clear. The gynecologist recommended a hysterectomy or partial hysterectomy, but  the patient decided to hold off on the surgery.  The patient also mentioned a concern about mild aortic atherosclerosis noted on her PET scan. She has been adhering to her prescribed medication regimen, which includes losartan, triamterene HCTZ, metformin XR, Mounjaro, and rosuvastatin.         Medications: Outpatient Medications Prior to Visit  Medication Sig   CAMILA 0.35 MG tablet Take 1 tablet by mouth daily.   glucose blood (ONE TOUCH ULTRA TEST) test strip Check blood glucose readings ACHS   Lancets (ONETOUCH ULTRASOFT) lancets Check blood sugar ACHS   rosuvastatin (CRESTOR) 10 MG tablet Take 1 tablet (10 mg total) by mouth daily.   [DISCONTINUED] losartan (COZAAR) 25 MG tablet Take 1 tablet (25 mg total) by mouth daily.   [DISCONTINUED] metFORMIN (GLUCOPHAGE-XR) 500 MG 24 hr tablet Take 2 tablets (1,000 mg total) by mouth 2 (two) times daily with a meal. With meal   [DISCONTINUED] tirzepatide (MOUNJARO) 5 MG/0.5ML Pen Inject 5 mg into the skin once a week.   [DISCONTINUED] triamterene-hydrochlorothiazide (MAXZIDE-25) 37.5-25 MG tablet Take 1 tablet by mouth daily.   [DISCONTINUED] clindamycin (CLEOCIN) 300 MG capsule Take 1 capsule (300 mg total) by mouth 3 (three) times daily.   [DISCONTINUED] Drospirenone (SLYND) 4 MG TABS Take by mouth.   No facility-administered medications prior to visit.    Review of Systems     Objective    BP (!) 125/91 (BP Location: Left Arm, Patient Position: Sitting, Cuff Size: Normal)  Pulse (!) 102   Ht 5\' 3"  (1.6 m)   Wt 144 lb 14.4 oz (65.7 kg)   LMP 02/27/2023 (Approximate)   BMI 25.67 kg/m    Physical Exam Vitals reviewed.  Constitutional:      General: She is not in acute distress.    Appearance: Normal appearance. She is well-developed. She is not diaphoretic.  HENT:     Head: Normocephalic and atraumatic.  Eyes:     General: No scleral icterus.    Conjunctiva/sclera: Conjunctivae normal.  Neck:     Thyroid: No thyromegaly.   Cardiovascular:     Rate and Rhythm: Normal rate and regular rhythm.     Pulses: Normal pulses.     Heart sounds: Normal heart sounds. No murmur heard. Pulmonary:     Effort: Pulmonary effort is normal. No respiratory distress.     Breath sounds: Normal breath sounds. No wheezing, rhonchi or rales.  Musculoskeletal:     Cervical back: Neck supple.     Right lower leg: No edema.     Left lower leg: No edema.  Lymphadenopathy:     Cervical: No cervical adenopathy.  Skin:    General: Skin is warm and dry.     Findings: No rash.  Neurological:     Mental Status: She is alert and oriented to person, place, and time. Mental status is at baseline.  Psychiatric:        Mood and Affect: Mood normal.        Behavior: Behavior normal.      No results found for any visits on 03/23/23.  Assessment & Plan     Problem List Items Addressed This Visit       Cardiovascular and Mediastinum   Hypertension associated with diabetes (HCC) - Primary   Managed with losartan 25 mg daily and triamterene/HCTZ 25 mg daily. Blood pressure control is crucial to reduce cardiovascular risk. - Refill losartan 25 mg daily - Refill triamterene/HCTZ 25 mg daily      Relevant Medications   metFORMIN (GLUCOPHAGE-XR) 500 MG 24 hr tablet   losartan (COZAAR) 25 MG tablet   tirzepatide (MOUNJARO) 5 MG/0.5ML Pen   triamterene-hydrochlorothiazide (MAXZIDE-25) 37.5-25 MG tablet   Aortic atherosclerosis (HCC)   Continue risk factor management Continue Crestor 10mg  daily      Relevant Medications   losartan (COZAAR) 25 MG tablet   triamterene-hydrochlorothiazide (MAXZIDE-25) 37.5-25 MG tablet     Endocrine   Diabetes (HCC)   Diabetes managed with metformin XR and Mounjaro. Recent A1c improved to 5.8%, indicating good glycemic control. Discussed that thyroid issues may impact diabetes management. - Continue metformin XR 1000 mg in the morning and 500 mg at night - Continue Mounjaro 5 mg weekly - Refill  metformin XR - Refill Mounjaro      Relevant Medications   metFORMIN (GLUCOPHAGE-XR) 500 MG 24 hr tablet   losartan (COZAAR) 25 MG tablet   tirzepatide [facility]) 5 MG/0.5ML Pen   Hyperlipidemia associated with type 2 diabetes mellitus (HCC)   On rosuvastatin 10 mg daily since November. Goal to reduce LDL below 70 mg/dL due to increased cardiovascular risk associated with diabetes. Mild aortic atherosclerosis noted on PET scan, managed with current statin therapy. 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 Referred to Dr. Roma Schanz at [facility] for management. Total thyroidectomy recommended due to poorly differentiated component. Post-thyroidectomy, thyroid hormone replacement will be necessary and manageable. PET scan to provide additional information but not expected to change the surgical plan. - Proceed with total thyroidectomy as recommended by Dr. Roma Schanz - Follow up with endocrinology post-surgery for thyroid hormone replacement and monitoring  Uterine Fibroids Uterine fibroids identified on ultrasound. Hysterectomy recommended but deferred due to concurrent thyroid cancer treatment. Biopsy clear, no immediate action required. Patient prefers to delay surgery. No further testing needed from PET scan results - Follow up with OB/GYN as needed  General Health Maintenance Routine health maintenance including mammogram and physical exam scheduling. Discussed importance of regular  screenings. - Obtain mammogram from GYN - Reschedule physical exam to May 23 or later to comply with insurance requirements  Follow-up - Follow up with endocrinology post-thyroidectomy - Follow up with OB/GYN for uterine fibroids - Recheck lipid panel and A1c at next physical in May.       Return in about 3 months (around 06/20/2023) for CPE after 06/30/23.       Shirlee Latch, MD  [facility] [phone] (phone) [phone] (fax)  [facility]

## 2023-03-23 NOTE — Assessment & Plan Note (Signed)
Continue risk factor management Continue Crestor 10mg  daily

## 2023-03-23 NOTE — Assessment & Plan Note (Signed)
Managed with losartan 25 mg daily and triamterene/HCTZ 25 mg daily. Blood pressure control is crucial to reduce cardiovascular risk. - Refill losartan 25 mg daily - Refill triamterene/HCTZ 25 mg daily

## 2023-03-23 NOTE — Assessment & Plan Note (Signed)
Diabetes managed with metformin XR and Mounjaro. Recent A1c improved to 5.8%, indicating good glycemic control. Discussed that thyroid issues may impact diabetes management. - Continue metformin XR 1000 mg in the morning and 500 mg at night - Continue Mounjaro 5 mg weekly - Refill metformin XR - Refill Mounjaro

## 2023-06-13 ENCOUNTER — Encounter: Payer: Self-pay | Admitting: Family Medicine

## 2023-06-13 ENCOUNTER — Encounter: Payer: BC Managed Care – PPO | Admitting: Family Medicine

## 2023-07-04 ENCOUNTER — Encounter: Payer: Self-pay | Admitting: Family Medicine

## 2023-07-04 ENCOUNTER — Ambulatory Visit (INDEPENDENT_AMBULATORY_CARE_PROVIDER_SITE_OTHER): Payer: 59 | Admitting: Family Medicine

## 2023-07-04 VITALS — BP 120/80 | HR 89 | Ht 63.0 in | Wt 145.1 lb

## 2023-07-04 DIAGNOSIS — Z1231 Encounter for screening mammogram for malignant neoplasm of breast: Secondary | ICD-10-CM | POA: Diagnosis not present

## 2023-07-04 DIAGNOSIS — E1169 Type 2 diabetes mellitus with other specified complication: Secondary | ICD-10-CM | POA: Diagnosis not present

## 2023-07-04 DIAGNOSIS — Z0001 Encounter for general adult medical examination with abnormal findings: Secondary | ICD-10-CM

## 2023-07-04 DIAGNOSIS — I700 Atherosclerosis of aorta: Secondary | ICD-10-CM

## 2023-07-04 DIAGNOSIS — E1159 Type 2 diabetes mellitus with other circulatory complications: Secondary | ICD-10-CM

## 2023-07-04 DIAGNOSIS — C73 Malignant neoplasm of thyroid gland: Secondary | ICD-10-CM

## 2023-07-04 DIAGNOSIS — I152 Hypertension secondary to endocrine disorders: Secondary | ICD-10-CM

## 2023-07-04 DIAGNOSIS — E785 Hyperlipidemia, unspecified: Secondary | ICD-10-CM

## 2023-07-04 DIAGNOSIS — Z0000 Encounter for general adult medical examination without abnormal findings: Secondary | ICD-10-CM

## 2023-07-04 NOTE — Assessment & Plan Note (Signed)
 Diabetes management is ongoing. Scheduled for lab work in October with endocrinology. Due for annual urine test for diabetes. - Order blood tests including kidney and liver function, cholesterol, and A1c - Order urine test for diabetes

## 2023-07-04 NOTE — Assessment & Plan Note (Signed)
 Continue statin.

## 2023-07-04 NOTE — Assessment & Plan Note (Signed)
 On rosuvastatin  10 mg daily since November. Goal to reduce LDL below 70 mg/dL due to increased cardiovascular risk associated with diabetes. Mild aortic atherosclerosis noted on PET scan, managed with current statin therapy. - Continue rosuvastatin  10 mg daily - Recheck lipid panel

## 2023-07-04 NOTE — Progress Notes (Signed)
 Complete physical exam   Patient: Tracey Kennedy   DOB: 1981/01/12   43 y.o. Female  MRN: [id number] Visit Date: 07/04/2023  Today's healthcare provider: Aden Agreste, MD   Chief Complaint  Patient presents with   Annual Exam    Diet -  low fat and watching carb intake Exercise - walking one to two times a week for 10 to 20 minutes Feeling - well Sleeping - fairly well Concerns - none    Care Management    Pneumococcal Vaccine declined Pap Smear and Mammogram completed through GYN   Subjective    Tracey Kennedy is a 43 y.o. female who presents today for a complete physical exam.    Discussed the use of AI scribe software for clinical note transcription with the patient, who gave verbal consent to proceed.  History of Present Illness   Tracey Kennedy is a 43 year old female who presents for a routine physical exam.  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 She continues to follow up with endocrinology and is scheduled for an ultrasound. She has diabetes, with recent thyroid  and parathyroid hormone levels checked in April. Further lab work is scheduled for October. In November, a vaginal ultrasound revealed fibroids, also noted on a PET scan in January. Post-thyroid  surgery, she experienced hypercalcemia due to excessive calcium  intake, which resolved after discontinuing supplements. Her blood pressure was around 120/80 during a previous check. She has not received a pneumonia vaccine yet. No new concerns or symptoms are present.        Last depression screening scores    12/13/2022    8:17 AM 06/29/2022    4:10 PM 10/15/2021    4:16 PM  PHQ 2/9 Scores  PHQ - 2 Score 0 0 0  PHQ- 9 Score 0 1 0   Last fall risk screening    06/29/2022    4:10 PM  Fall Risk   Falls in the past year? 0  Number falls in past yr: 0  Injury with Fall? 0  Risk for fall due to : No Fall Risks   Follow up Falls evaluation completed        Medications: Outpatient Medications Prior to Visit  Medication Sig   CAMILA 0.35 MG tablet Take 1 tablet by mouth daily.   glucose blood (ONE TOUCH ULTRA TEST) test strip Check blood glucose readings ACHS   Lancets (ONETOUCH ULTRASOFT) lancets Check blood sugar ACHS   levothyroxine (SYNTHROID) 112 MCG tablet Take 112 mcg by mouth daily before breakfast.   losartan  (COZAAR ) 25 MG tablet Take 1 tablet (25 mg total) by mouth daily.   metFORMIN  (GLUCOPHAGE -XR) 500 MG 24 hr tablet Take 2 tablets (1,000 mg total) by mouth daily with breakfast AND 1 tablet (500 mg total) every evening. With meal.   rosuvastatin  (CRESTOR ) 10 MG tablet Take 1 tablet (10 mg total) by mouth daily.   tirzepatide (MOUNJARO) 5 MG/0.5ML Pen Inject 5 mg into the skin once a week.   triamterene -hydrochlorothiazide (MAXZIDE-25) 37.5-25 MG tablet Take 1 tablet by mouth daily.   No facility-administered medications prior to visit.    Review of Systems    Objective    BP 120/80 Comment: home reading  Pulse 89   Ht 5\' 3"  (1.6 m)   Wt 145 lb 1.6 oz (65.8 kg)   SpO2 100%   BMI 25.70 kg/m    Physical Exam Vitals reviewed.  Constitutional:      General: She is not in acute distress.    Appearance: Normal appearance. She is well-developed. She is not diaphoretic.  HENT:     Head: Normocephalic and atraumatic.     Right Ear: Tympanic membrane, ear canal and external ear normal.     Left Ear: Tympanic membrane, ear canal and external ear normal.     Nose: Nose normal.     Mouth/Throat:     Mouth: Mucous membranes are moist.     Pharynx: Oropharynx is clear. No oropharyngeal exudate.  Eyes:     General: No scleral icterus.    Conjunctiva/sclera: Conjunctivae normal.     Pupils: Pupils are equal, round, and reactive to light.  Neck:     Thyroid : No thyromegaly.  Cardiovascular:     Rate and Rhythm: Normal rate and regular rhythm.     Heart sounds: Normal heart  sounds. No murmur heard. Pulmonary:     Effort: Pulmonary effort is normal. No respiratory distress.     Breath sounds: Normal breath sounds. No wheezing or rales.  Abdominal:     General: There is no distension.     Palpations: Abdomen is soft.     Tenderness: There is no abdominal tenderness.  Musculoskeletal:        General: No deformity.     Cervical back: Neck supple.     Right lower leg: No edema.     Left lower leg: No edema.  Lymphadenopathy:     Cervical: No cervical adenopathy.  Skin:    General: Skin is warm and dry.     Findings: No rash.  Neurological:     Mental Status: She is alert and oriented to person, place, and time. Mental status is at baseline.     Gait: Gait normal.  Psychiatric:        Mood and Affect: Mood normal.        Behavior: Behavior normal.        Thought Content: Thought content normal.      No results found for any visits on 07/04/23.  Assessment & Plan    Routine Health Maintenance and Physical Exam  Exercise Activities and Dietary recommendations  Goals   None     Immunization History  Administered Date(s) Administered   Influenza,inj,Quad PF,6+ Mos 11/08/2018, 10/15/2021   Influenza,inj,Quad PF,6-35 Mos 11/12/2020   PFIZER(Purple Top)SARS-COV-2 Vaccination 04/07/2019, 04/30/2019, 02/18/2020   Td 04/06/2021    Health Maintenance  Topic Date Due   Pneumococcal Vaccine 42-47 Years old (1 of 2 - PCV) Never done   MAMMOGRAM  04/06/2022   Cervical Cancer Screening (HPV/Pap Cotest)  08/04/2022   COVID-19 Vaccine (4 - 2024-25 season) 10/09/2022   HEMOGLOBIN A1C  06/12/2023   FOOT EXAM  06/29/2023   Diabetic kidney evaluation - eGFR measurement  07/20/2023   Diabetic kidney evaluation - Urine ACR  07/20/2023   OPHTHALMOLOGY EXAM  08/23/2023   INFLUENZA VACCINE  09/08/2023   DTaP/Tdap/Td (2 - Tdap) 04/07/2031   Hepatitis C Screening  Completed   HIV Screening  Completed   HPV VACCINES  Aged Out   Meningococcal B Vaccine  Aged  Out    Discussed health benefits of physical activity, and encouraged her to engage in regular exercise appropriate for her age and condition.  Problem List Items Addressed This Visit       Cardiovascular and Mediastinum   Hypertension associated with diabetes (HCC)   Blood pressure slightly elevated today. Previous  readings were approximately 120/80. Will recheck before the end of the visit. - Recheck blood pressure before the end of the visit      Relevant Orders   Comprehensive metabolic panel with GFR   Aortic atherosclerosis (HCC)   Continue statin        Endocrine   Diabetes (HCC)   Diabetes management is ongoing. Scheduled for lab work in October with endocrinology. Due for annual urine test for diabetes. - Order blood tests including kidney and liver function, cholesterol, and A1c - Order urine test for diabetes      Relevant Orders   Hemoglobin A1c   Comprehensive metabolic panel with GFR   Microalbumin / creatinine urine ratio   Lipid panel   Hyperlipidemia associated with type 2 diabetes mellitus (HCC)   On rosuvastatin  10 mg daily since November. Goal to reduce LDL below 70 mg/dL due to increased cardiovascular risk associated with diabetes. Mild aortic atherosclerosis noted on PET scan, managed with current statin therapy. 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 No additional treatment required as per Dr. Ronelle Coffee. Currently on Synthroid 112 mcg daily. Follow-up with endocrinology.  - Continue Synthroid 112 mcg daily - Follow up with endocrinology      Relevant Medications   levothyroxine (SYNTHROID) 112 MCG tablet   Other Visit Diagnoses       Encounter for annual physical  exam    -  Primary   Relevant Orders   Hemoglobin A1c   Comprehensive metabolic panel with GFR   Microalbumin / creatinine urine ratio   Lipid panel     Encounter for screening mammogram for malignant neoplasm of breast               General Health Maintenance Discussed pneumonia vaccination due to increased risk from diabetes and papillary thyroid  cancer. She is considering the vaccine and will decide later. Explained that the vaccine is not a live virus and is generally well-tolerated with minimal side effects. - Consider pneumonia vaccination        Return in about 6 months (around 01/04/2024) for chronic disease f/u.     Aden Agreste, MD  [facility] [phone] (phone) [phone] (fax)  [facility]

## 2023-07-04 NOTE — Assessment & Plan Note (Signed)
 Papillary thyroid  cancer, post-thyroidectomy Status post total thyroidectomy in February for papillary thyroid  cancer. No additional treatment required as per Dr. Ronelle Coffee. Currently on Synthroid 112 mcg daily. Follow-up with endocrinology.  - Continue Synthroid 112 mcg daily - Follow up with endocrinology

## 2023-07-04 NOTE — Assessment & Plan Note (Signed)
 Blood pressure slightly elevated today. Previous readings were approximately 120/80. Will recheck before the end of the visit. - Recheck blood pressure before the end of the visit

## 2023-07-05 LAB — COMPREHENSIVE METABOLIC PANEL WITH GFR
ALT: 18 IU/L (ref 0–32)
AST: 21 IU/L (ref 0–40)
Albumin: 4.9 g/dL (ref 3.9–4.9)
Alkaline Phosphatase: 68 IU/L (ref 44–121)
BUN/Creatinine Ratio: 23 (ref 9–23)
BUN: 16 mg/dL (ref 6–24)
Bilirubin Total: 0.8 mg/dL (ref 0.0–1.2)
CO2: 19 mmol/L — ABNORMAL LOW (ref 20–29)
Calcium: 9.7 mg/dL (ref 8.7–10.2)
Chloride: 100 mmol/L (ref 96–106)
Creatinine, Ser: 0.71 mg/dL (ref 0.57–1.00)
Globulin, Total: 2.6 g/dL (ref 1.5–4.5)
Glucose: 101 mg/dL — ABNORMAL HIGH (ref 70–99)
Potassium: 4.5 mmol/L (ref 3.5–5.2)
Sodium: 138 mmol/L (ref 134–144)
Total Protein: 7.5 g/dL (ref 6.0–8.5)
eGFR: 107 mL/min/{1.73_m2} (ref >59)

## 2023-07-05 LAB — LIPID PANEL
Chol/HDL Ratio: 2.3 ratio (ref 0.0–4.4)
Cholesterol, Total: 109 mg/dL (ref 100–199)
HDL: 48 mg/dL (ref >39)
LDL Chol Calc (NIH): 45 mg/dL (ref 0–99)
Triglycerides: 78 mg/dL (ref 0–149)
VLDL Cholesterol Cal: 16 mg/dL (ref 5–40)

## 2023-07-05 LAB — MICROALBUMIN / CREATININE URINE RATIO
Creatinine, Urine: 51.4 mg/dL
Microalb/Creat Ratio: 6 mg/g{creat} (ref 0–29)
Microalbumin, Urine: 3 ug/mL

## 2023-07-05 LAB — HEMOGLOBIN A1C
Est. average glucose Bld gHb Est-mCnc: 126 mg/dL
Hgb A1c MFr Bld: 6 % — ABNORMAL HIGH (ref 4.8–5.6)

## 2023-07-06 ENCOUNTER — Ambulatory Visit: Payer: Self-pay | Admitting: Family Medicine

## 2023-08-12 ENCOUNTER — Other Ambulatory Visit: Payer: Self-pay | Admitting: Family Medicine

## 2023-08-12 DIAGNOSIS — E119 Type 2 diabetes mellitus without complications: Secondary | ICD-10-CM

## 2023-08-15 NOTE — Telephone Encounter (Signed)
 Requested medication (s) are due for refill today: yes  Requested medication (s) are on the active medication list: yes  Last refill:  03/23/23 1 ml 3 RF   Future visit scheduled: yes  Notes to clinic:  Med not assigned to a protocol   Requested Prescriptions  Pending Prescriptions Disp Refills   MOUNJARO  5 MG/0.5ML Pen [Pharmacy Med Name: MOUNJARO  5 MG/0.5 ML PEN]  3    Sig: INJECT 5 MG SUBCUTANEOUSLY WEEKLY     Off-Protocol Failed - 08/15/2023 12:36 PM      Failed - Medication not assigned to a protocol, review manually.      Passed - Valid encounter within last 12 months    Recent Outpatient Visits           1 month ago Encounter for annual physical exam   [facility] [place], Jon HERO, MD   4 months ago Hypertension associated with diabetes [facility])   [facility], Jon HERO, MD

## 2023-08-28 ENCOUNTER — Encounter: Payer: Self-pay | Admitting: Family Medicine

## 2023-08-28 LAB — HM DIABETES EYE EXAM

## 2023-11-17 ENCOUNTER — Other Ambulatory Visit: Payer: Self-pay | Admitting: Family Medicine

## 2023-11-17 DIAGNOSIS — E119 Type 2 diabetes mellitus without complications: Secondary | ICD-10-CM

## 2023-11-26 ENCOUNTER — Other Ambulatory Visit: Payer: Self-pay | Admitting: Family Medicine

## 2023-11-26 DIAGNOSIS — E1169 Type 2 diabetes mellitus with other specified complication: Secondary | ICD-10-CM

## 2023-12-28 ENCOUNTER — Encounter: Payer: Self-pay | Admitting: Family Medicine

## 2023-12-28 ENCOUNTER — Ambulatory Visit: Admitting: Family Medicine

## 2023-12-28 VITALS — BP 119/80 | HR 87 | Ht 63.0 in | Wt 142.1 lb

## 2023-12-28 DIAGNOSIS — E890 Postprocedural hypothyroidism: Secondary | ICD-10-CM | POA: Diagnosis not present

## 2023-12-28 DIAGNOSIS — Z7984 Long term (current) use of oral hypoglycemic drugs: Secondary | ICD-10-CM

## 2023-12-28 DIAGNOSIS — I152 Hypertension secondary to endocrine disorders: Secondary | ICD-10-CM | POA: Diagnosis not present

## 2023-12-28 DIAGNOSIS — E1159 Type 2 diabetes mellitus with other circulatory complications: Secondary | ICD-10-CM | POA: Diagnosis not present

## 2023-12-28 DIAGNOSIS — E1169 Type 2 diabetes mellitus with other specified complication: Secondary | ICD-10-CM

## 2023-12-28 DIAGNOSIS — E785 Hyperlipidemia, unspecified: Secondary | ICD-10-CM

## 2023-12-28 MED ORDER — METFORMIN HCL ER 500 MG PO TB24: 500.0000 mg | ORAL_TABLET | Freq: Every day | ORAL | 3 refills | Status: DC

## 2023-12-28 MED ORDER — BLOOD GLUCOSE MONITORING SUPPL DEVI: 1.0000 | 0 refills | Status: AC

## 2023-12-28 MED ORDER — LANCETS MISC
1.0000 | 0 refills | Status: AC
Start: 2023-12-28 — End: ?

## 2023-12-28 MED ORDER — BLOOD GLUCOSE TEST VI STRP: 1.0000 | ORAL_STRIP | 0 refills | Status: AC

## 2023-12-28 MED ORDER — LANCET DEVICE MISC: 1.0000 | 0 refills | Status: AC

## 2023-12-28 NOTE — Assessment & Plan Note (Signed)
 Managed with Crestor  10 mg daily.

## 2023-12-28 NOTE — Assessment & Plan Note (Signed)
Well-controlled with current medication regimen

## 2023-12-28 NOTE — Assessment & Plan Note (Signed)
 Managed with Synthroid 112 mcg daily. Thyroid  function tests are stable. - Continue Synthroid 112 mcg daily. - f/b Endocrinology

## 2023-12-28 NOTE — Progress Notes (Signed)
 Established patient visit   Patient: Tracey Kennedy   DOB: 08/10/1980   43 y.o. Female  MRN: [id number] Visit Date: 12/28/2023  Today's healthcare provider: Jon Eva, MD   Chief Complaint  Patient presents with   Medical Management of Chronic Issues   Diabetes    Monitors at home with avg of 100-120 mg/dL fasting. No hypoglycemic episodes. No symptoms to report   Hypertension    Patient reports monitoring at home. She does not smoke. No symptoms to report   Hypothyroidism   Subjective     HPI     Diabetes    Additional comments: Monitors at home with avg of 100-120 mg/dL fasting. No hypoglycemic episodes. No symptoms to report        Hypertension    Additional comments: Patient reports monitoring at home. She does not smoke. No symptoms to report      Last edited by Lilian Fitzpatrick, CMA on 12/28/2023  3:47 PM.       Discussed the use of AI scribe software for clinical note transcription with the patient, who gave verbal consent to proceed.  History of Present Illness   Tracey Kennedy is a 43 year old female with diabetes, hypertension, hyperlipidemia, and postoperative hypothyroidism who presents for a six-month follow-up.  She is pleased with her recent A1c of 5.9. Her current medications include Maxzide 25 mg daily, Mounjaro  5 mg weekly, Crestor  10 mg daily, metformin  XR 1000 mg in the morning and 500 mg in the evening with meals, losartan  25 mg daily, and Synthroid 112 mcg daily.  She has not experienced significant weight change with Mounjaro  but has decreased cravings and appetite, feeling full faster, which helps prevent overeating.  She received a letter from [facility] regarding changes in coverage for lancets and test strips for her glucose meter. She acknowledges infrequent home glucose monitoring but states her sugar levels are good.  She had a mammogram this summer at [facility] and is due for a foot exam. She is  scheduled to see her endocrinologist in early April for an ultrasound and follow-up.        Medications: Outpatient Medications Prior to Visit  Medication Sig   CAMILA 0.35 MG tablet Take 1 tablet by mouth daily.   levothyroxine (SYNTHROID) 112 MCG tablet Take 112 mcg by mouth daily before breakfast.   losartan  (COZAAR ) 25 MG tablet Take 1 tablet (25 mg total) by mouth daily.   rosuvastatin  (CRESTOR ) 10 MG tablet TAKE 1 TABLET BY MOUTH EVERY DAY   tirzepatide  (MOUNJARO ) 5 MG/0.5ML Pen INJECT 5 MG SUBCUTANEOUSLY WEEKLY   triamterene -hydrochlorothiazide (MAXZIDE-25) 37.5-25 MG tablet Take 1 tablet by mouth daily.   [DISCONTINUED] glucose blood (ONE TOUCH ULTRA TEST) test strip Check blood glucose readings ACHS   [DISCONTINUED] Lancets (ONETOUCH ULTRASOFT) lancets Check blood sugar ACHS   [DISCONTINUED] metFORMIN  (GLUCOPHAGE -XR) 500 MG 24 hr tablet Take 2 tablets (1,000 mg total) by mouth daily with breakfast AND 1 tablet (500 mg total) every evening. With meal.   No facility-administered medications prior to visit.    Review of Systems     Objective    BP 119/80 (BP Location: Left Arm, Patient Position: Sitting, Cuff Size: Normal)   Pulse 87   Ht 5' 3 (1.6 m)   Wt 142 lb 1.6 oz (64.5 kg)   SpO2 100%   BMI 25.17 kg/m    Physical Exam Vitals reviewed.  Constitutional:      General: She is  not in acute distress.    Appearance: Normal appearance. She is well-developed. She is not diaphoretic.  HENT:     Head: Normocephalic and atraumatic.  Eyes:     General: No scleral icterus.    Conjunctiva/sclera: Conjunctivae normal.  Neck:     Thyroid : No thyromegaly.  Cardiovascular:     Rate and Rhythm: Normal rate and regular rhythm.     Heart sounds: Normal heart sounds. No murmur heard. Pulmonary:     Effort: Pulmonary effort is normal. No respiratory distress.     Breath sounds: Normal breath sounds. No wheezing, rhonchi or rales.  Musculoskeletal:     Cervical back: Neck  supple.     Right lower leg: No edema.     Left lower leg: No edema.  Lymphadenopathy:     Cervical: No cervical adenopathy.  Skin:    General: Skin is warm and dry.     Findings: No rash.  Neurological:     Mental Status: She is alert and oriented to person, place, and time. Mental status is at baseline.  Psychiatric:        Mood and Affect: Mood normal.        Behavior: Behavior normal.      No results found for any visits on 12/28/23.  Assessment & Plan     Problem List Items Addressed This Visit       Cardiovascular and Mediastinum   Hypertension associated with diabetes (HCC) - Primary   Well-controlled with current medication regimen.      Relevant Medications   metFORMIN  (GLUCOPHAGE -XR) 500 MG 24 hr tablet     Endocrine   Diabetes (HCC)   Well-controlled with an A1c of 5.9. Current regimen includes Mounjaro  and metformin . Mounjaro  aids in appetite control, though weight loss is not significant. She is interested in reducing medication burden. - Reduced metformin  to 500 mg once daily in the morning. - Continue Mounjaro  5 mg weekly. - Monitor blood glucose levels and A1c in six months. - Sent generic prescription for glucose meter, test strips, and lancets to pharmacy.      Relevant Medications   metFORMIN  (GLUCOPHAGE -XR) 500 MG 24 hr tablet   Blood Glucose Monitoring Suppl DEVI   Glucose Blood (BLOOD GLUCOSE TEST STRIPS) STRP   Lancet Device MISC   Lancets MISC   Hyperlipidemia associated with type 2 diabetes mellitus (HCC)   Managed with Crestor  10 mg daily.      Relevant Medications   metFORMIN  (GLUCOPHAGE -XR) 500 MG 24 hr tablet   Postoperative hypothyroidism   Managed with Synthroid 112 mcg daily. Thyroid  function tests are stable. - Continue Synthroid 112 mcg daily. - f/b Endocrinology          General Health Maintenance Routine health maintenance is up to date. Mammogram was completed in the summer. Foot exam is due. - Scheduled foot exam. -  Sent request for mammogram records to [facility]. - Plan for six-month physical exam and labs.        Return in about 6 months (around 06/26/2024) for CPE.       Jon Eva, MD  [facility] [phone] (phone) [phone] (fax)  [facility]

## 2023-12-28 NOTE — Assessment & Plan Note (Signed)
 Well-controlled with an A1c of 5.9. Current regimen includes Mounjaro  and metformin . Mounjaro  aids in appetite control, though weight loss is not significant. She is interested in reducing medication burden. - Reduced metformin  to 500 mg once daily in the morning. - Continue Mounjaro  5 mg weekly. - Monitor blood glucose levels and A1c in six months. - Sent generic prescription for glucose meter, test strips, and lancets to pharmacy.

## 2024-01-30 ENCOUNTER — Other Ambulatory Visit: Payer: Self-pay | Admitting: Family Medicine

## 2024-01-30 DIAGNOSIS — I152 Hypertension secondary to endocrine disorders: Secondary | ICD-10-CM

## 2024-02-11 ENCOUNTER — Other Ambulatory Visit: Payer: Self-pay | Admitting: Family Medicine

## 2024-02-11 DIAGNOSIS — E119 Type 2 diabetes mellitus without complications: Secondary | ICD-10-CM

## 2024-06-25 ENCOUNTER — Ambulatory Visit: Admitting: Family Medicine
